# Patient Record
Sex: Female | Born: 1985 | Race: White | Hispanic: No | Smoking: Former smoker
Health system: Southern US, Academic
[De-identification: ages and names within clinical notes are randomized; demographics above are authoritative.]

## PROBLEM LIST (undated history)

## (undated) DIAGNOSIS — F4310 Post-traumatic stress disorder, unspecified: Secondary | ICD-10-CM

## (undated) DIAGNOSIS — Z22322 Carrier or suspected carrier of Methicillin resistant Staphylococcus aureus: Secondary | ICD-10-CM

## (undated) DIAGNOSIS — F419 Anxiety disorder, unspecified: Secondary | ICD-10-CM

## (undated) DIAGNOSIS — F329 Major depressive disorder, single episode, unspecified: Secondary | ICD-10-CM

## (undated) DIAGNOSIS — C539 Malignant neoplasm of cervix uteri, unspecified: Secondary | ICD-10-CM

## (undated) DIAGNOSIS — F32A Depression, unspecified: Secondary | ICD-10-CM

## (undated) DIAGNOSIS — K5010 Crohn's disease of large intestine without complications: Secondary | ICD-10-CM

## (undated) DIAGNOSIS — Z9851 Tubal ligation status: Secondary | ICD-10-CM

## (undated) DIAGNOSIS — S83519A Sprain of anterior cruciate ligament of unspecified knee, initial encounter: Secondary | ICD-10-CM

## (undated) DIAGNOSIS — M5430 Sciatica, unspecified side: Secondary | ICD-10-CM

## (undated) DIAGNOSIS — B199 Unspecified viral hepatitis without hepatic coma: Secondary | ICD-10-CM

## (undated) DIAGNOSIS — Z87898 Personal history of other specified conditions: Secondary | ICD-10-CM

## (undated) DIAGNOSIS — B002 Herpesviral gingivostomatitis and pharyngotonsillitis: Secondary | ICD-10-CM

## (undated) DIAGNOSIS — R569 Unspecified convulsions: Secondary | ICD-10-CM

## (undated) DIAGNOSIS — B1920 Unspecified viral hepatitis C without hepatic coma: Secondary | ICD-10-CM

## (undated) DIAGNOSIS — R748 Abnormal levels of other serum enzymes: Secondary | ICD-10-CM

## (undated) DIAGNOSIS — F1991 Other psychoactive substance use, unspecified, in remission: Secondary | ICD-10-CM

## (undated) DIAGNOSIS — A6000 Herpesviral infection of urogenital system, unspecified: Secondary | ICD-10-CM

## (undated) DIAGNOSIS — O009 Unspecified ectopic pregnancy without intrauterine pregnancy: Secondary | ICD-10-CM

## (undated) DIAGNOSIS — B977 Papillomavirus as the cause of diseases classified elsewhere: Secondary | ICD-10-CM

## (undated) DIAGNOSIS — Z87440 Personal history of urinary (tract) infections: Secondary | ICD-10-CM

## (undated) DIAGNOSIS — G4700 Insomnia, unspecified: Secondary | ICD-10-CM

## (undated) HISTORY — DX: Unspecified viral hepatitis without hepatic coma: B19.9

## (undated) HISTORY — PX: DILATION AND CURETTAGE, DIAGNOSTIC / THERAPEUTIC: SUR384

## (undated) HISTORY — DX: Depression, unspecified: F32.A

## (undated) HISTORY — PX: HX TONSILLECTOMY: SHX27

## (undated) HISTORY — DX: Tubal ligation status: Z98.51

## (undated) HISTORY — DX: Abnormal levels of other serum enzymes: R74.8

## (undated) HISTORY — DX: Sprain of anterior cruciate ligament of unspecified knee, initial encounter: S83.519A

## (undated) HISTORY — PX: HX TUBAL LIGATION: SHX77

## (undated) HISTORY — DX: Personal history of other specified conditions: Z87.898

## (undated) HISTORY — DX: Other psychoactive substance use, unspecified, in remission: F19.91

## (undated) HISTORY — DX: Anxiety disorder, unspecified: F41.9

## (undated) HISTORY — PX: ECTOPIC PREGNANCY SURGERY: SHX613

## (undated) HISTORY — PX: TONSILLECTOMY: SUR1361

## (undated) HISTORY — PX: TUBAL LIGATION: SHX77

---
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
## 2007-01-31 ENCOUNTER — Other Ambulatory Visit: Payer: Self-pay

## 2007-02-05 LAB — HISTORICAL SURGICAL PATHOLOGY SPECIMEN

## 2009-03-09 ENCOUNTER — Other Ambulatory Visit: Payer: Self-pay

## 2009-03-10 LAB — HISTORICAL SURGICAL PATHOLOGY SPECIMEN

## 2010-08-19 ENCOUNTER — Emergency Department (HOSPITAL_COMMUNITY): Payer: Self-pay

## 2014-01-02 HISTORY — PX: COLONOSCOPY: WVUENDOPRO10

## 2014-04-28 LAB — CBC
HCT: 40.9
HGB: 13.7
PLATELET COUNT: 347
WBC: 10.6

## 2014-04-28 LAB — NEISSERIA GONORRHOEAE DNA BY PCR
SPECIMEN SITE: NEGATIVE
SPECIMEN SITE: NEGATIVE

## 2014-04-28 LAB — RUBELLA VIRUS IGG AB: RUBELLA IGG: IMMUNE

## 2014-04-28 LAB — CYTOPATHOLOGY, GYN +/- HIGH RISK HPV: PAP SMEAR (1) DX PRO FEE: ABNORMAL

## 2014-04-28 LAB — CHLAMYDIA TRACHOMITIS DNA BY PCR (INHOUSE)
CHLAMYDIA TRACHOMATIS: NEGATIVE
CHLAMYDIA TRACHOMATIS: NEGATIVE

## 2014-04-28 LAB — SERUM SYPHILIS TEST: RPR: NEGATIVE

## 2014-05-21 ENCOUNTER — Encounter (INDEPENDENT_AMBULATORY_CARE_PROVIDER_SITE_OTHER): Payer: Self-pay

## 2014-05-21 DIAGNOSIS — O0990 Supervision of high risk pregnancy, unspecified, unspecified trimester: Secondary | ICD-10-CM | POA: Insufficient documentation

## 2014-05-21 DIAGNOSIS — O09219 Supervision of pregnancy with history of pre-term labor, unspecified trimester: Secondary | ICD-10-CM

## 2014-05-21 DIAGNOSIS — F1191 Opioid use, unspecified, in remission: Secondary | ICD-10-CM

## 2014-05-21 DIAGNOSIS — F419 Anxiety disorder, unspecified: Secondary | ICD-10-CM | POA: Insufficient documentation

## 2014-05-21 DIAGNOSIS — F1990 Other psychoactive substance use, unspecified, uncomplicated: Secondary | ICD-10-CM | POA: Insufficient documentation

## 2014-05-21 DIAGNOSIS — Z8614 Personal history of Methicillin resistant Staphylococcus aureus infection: Secondary | ICD-10-CM | POA: Insufficient documentation

## 2014-05-21 DIAGNOSIS — IMO0002 Reserved for concepts with insufficient information to code with codable children: Secondary | ICD-10-CM | POA: Insufficient documentation

## 2014-05-21 DIAGNOSIS — O09292 Supervision of pregnancy with other poor reproductive or obstetric history, second trimester: Secondary | ICD-10-CM | POA: Insufficient documentation

## 2014-05-21 DIAGNOSIS — F329 Major depressive disorder, single episode, unspecified: Secondary | ICD-10-CM

## 2014-05-21 DIAGNOSIS — Z98891 History of uterine scar from previous surgery: Secondary | ICD-10-CM

## 2014-05-21 DIAGNOSIS — F32A Depression, unspecified: Secondary | ICD-10-CM | POA: Insufficient documentation

## 2014-05-21 DIAGNOSIS — F1991 Other psychoactive substance use, unspecified, in remission: Secondary | ICD-10-CM | POA: Insufficient documentation

## 2014-05-21 DIAGNOSIS — Z87898 Personal history of other specified conditions: Principal | ICD-10-CM | POA: Insufficient documentation

## 2014-05-21 DIAGNOSIS — O09899 Supervision of other high risk pregnancies, unspecified trimester: Secondary | ICD-10-CM | POA: Insufficient documentation

## 2014-05-21 HISTORY — DX: Reserved for concepts with insufficient information to code with codable children: IMO0002

## 2014-05-21 HISTORY — DX: History of uterine scar from previous surgery: Z98.891

## 2014-05-21 HISTORY — DX: Personal history of Methicillin resistant Staphylococcus aureus infection: Z86.14

## 2014-05-21 HISTORY — DX: Supervision of pregnancy with other poor reproductive or obstetric history, second trimester: O09.292

## 2014-05-21 NOTE — Progress Notes (Signed)
Request for High Risk OB Consult from: Dr. Roderic Scarce      Reason for request: Hx of Heroine use    Records reviewed    Age: 29    Gravida/Para: [id number] (1 SAB & 1 ectopic)    LMP: 01/13/2014          EDD: 10/20/2014      Ultrasound on _4/26/2016_ = 9_ weeks _4_ days for EDD: 11/27/2014      Best EDD: 11/27/2014 (ultrasound)    Current Gestational age on:  05/21/2014 = [redacted]w[redacted]d gestation           Information:   Hx of preterm delivery 2009 - [redacted] weeks gestation  Hx of C-section 2009 - NRFHT; request OP report  Hx of MRSA - abscess left forearm; underwent I&D  Hx of anxiety and depression - weaning off Zoloft, Buspar, and Seroquel  Hx of second trimester loss - 18 week twin gestation  Family history of Cystic Fibrosis carrier - daughter  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

## 2014-06-15 ENCOUNTER — Other Ambulatory Visit (INDEPENDENT_AMBULATORY_CARE_PROVIDER_SITE_OTHER): Payer: Self-pay

## 2014-06-15 ENCOUNTER — Ambulatory Visit (INDEPENDENT_AMBULATORY_CARE_PROVIDER_SITE_OTHER): Payer: Medicaid Other

## 2015-05-28 ENCOUNTER — Encounter (HOSPITAL_COMMUNITY): Payer: Self-pay

## 2015-10-06 ENCOUNTER — Emergency Department
Admission: EM | Admit: 2015-10-06 | Discharge: 2015-10-06 | Disposition: A | Discharge status: Home / Self Care | Payer: Medicaid - Out of State | Attending: Emergency Medicine | Admitting: Emergency Medicine

## 2015-10-06 ENCOUNTER — Encounter: Payer: Self-pay | Admitting: Intensive Care

## 2015-10-06 DIAGNOSIS — Y999 Unspecified external cause status: Secondary | ICD-10-CM | POA: Insufficient documentation

## 2015-10-06 DIAGNOSIS — Z8541 Personal history of malignant neoplasm of cervix uteri: Secondary | ICD-10-CM | POA: Diagnosis not present

## 2015-10-06 DIAGNOSIS — L089 Local infection of the skin and subcutaneous tissue, unspecified: Secondary | ICD-10-CM | POA: Insufficient documentation

## 2015-10-06 DIAGNOSIS — Y9289 Other specified places as the place of occurrence of the external cause: Secondary | ICD-10-CM | POA: Insufficient documentation

## 2015-10-06 DIAGNOSIS — Y9389 Activity, other specified: Secondary | ICD-10-CM | POA: Diagnosis not present

## 2015-10-06 DIAGNOSIS — S90561A Insect bite (nonvenomous), right ankle, initial encounter: Secondary | ICD-10-CM | POA: Insufficient documentation

## 2015-10-06 DIAGNOSIS — L02415 Cutaneous abscess of right lower limb: Secondary | ICD-10-CM | POA: Diagnosis present

## 2015-10-06 DIAGNOSIS — W57XXXA Bitten or stung by nonvenomous insect and other nonvenomous arthropods, initial encounter: Secondary | ICD-10-CM | POA: Insufficient documentation

## 2015-10-06 HISTORY — DX: Malignant neoplasm of cervix uteri, unspecified: C53.9

## 2015-10-06 HISTORY — DX: Post-traumatic stress disorder, unspecified: F43.10

## 2015-10-06 HISTORY — DX: Carrier or suspected carrier of methicillin resistant Staphylococcus aureus: Z22.322

## 2015-10-06 HISTORY — DX: Depression, unspecified: F32.A

## 2015-10-06 HISTORY — DX: Anxiety disorder, unspecified: F41.9

## 2015-10-06 HISTORY — DX: Major depressive disorder, single episode, unspecified: F32.9

## 2015-10-06 NOTE — ED Notes (Signed)
Pt has insect bite to inner aspect of right ankle - area is red and irritated - provider in to examine pt

## 2015-10-06 NOTE — ED Triage Notes (Signed)
Patient presents to ER with c/o of possible spider bite. She states "I was cutting wood and getting eat up alive by bugs so I am not sure what bit me." Patient has small abscess on R lower outer leg. No drainage noted. Redness around area. Patient reports she cannot feel all around area. A&O x4

## 2015-10-06 NOTE — Discharge Instructions (Signed)
You appear to have a local reaction to the ankle. It does not appear to be infected at this time. You have created some local skin irritation with the use of alcohol and peroxide. Avoid using these products to cleanse you wound. Use only soap and water and cover with antibiotic ointment. Follow-up with [facility] as needed.

## 2015-10-07 NOTE — ED Provider Notes (Signed)
[facility] Emergency Department Provider Note ____________________________________________  Time seen: 1857  I have reviewed the triage vital signs and the nursing notes.  HISTORY  Chief Complaint  Abscess  HPI Tracey Torres is a 30 y.o. female visits to the ED for evaluation what she assumes be a spider or insect bite. She describes last week she was cutting wood and was exposed to several bugs that were biting her constantly. At some point she sustained what she assumes be an insect bite to the right medial ankle. She describes what was initially a bump that appeared like a mosquito bite. She does have redness and inflammation to the area. Since that time she's been cleansing the area multiple times daily with alcohol and peroxide. She now has a focal area of redness with a central ulcer that is dried at this time. She denies any spread of the redness from the area. She denies any interim fevers, chills, or sweats. She also denies any purulent drainage from the area.  Past Medical History:  Diagnosis Date  . Anxiety   . Cervical cancer ([place])   . Depression   . MRSA carrier   . PTSD (post-traumatic stress disorder)     There are no active problems to display for this patient.   Past Surgical History:  Procedure Laterality Date  . ECTOPIC PREGNANCY SURGERY    . TONSILLECTOMY    . TUBAL LIGATION      Prior to Admission medications   Not on File    Allergies Amoxicillin; Dye fdc red [red dye]; Shellfish allergy; and Motrin [ibuprofen]  History reviewed. No pertinent family history.  Social History Social History  Substance Use Topics  . Smoking status: Never Smoker  . Smokeless tobacco: Never Used  . Alcohol use No    Review of Systems  Constitutional: Negative for fever. Cardiovascular: Negative for chest pain. Respiratory: Negative for shortness of breath. Gastrointestinal: Negative for abdominal pain, vomiting and  diarrhea. Musculoskeletal: Negative for back pain. Skin: Negative for rash. Insect bite as above Neurological: Negative for headaches, focal weakness or numbness. ____________________________________________  PHYSICAL EXAM:  VITAL SIGNS: ED Triage Vitals [10/06/15 1825]  Enc Vitals Group     BP 120/86     Pulse Rate 92     Resp 16     Temp 98.2 F (36.8 C)     Temp Source Oral     SpO2 98 %     Weight 150 lb (68 kg)     Height 5\' 4"  (1.626 m)     Head Circumference      Peak Flow      Pain Score 8     Pain Loc      Pain Edu?      Excl. in [place]?    Constitutional: Alert and oriented. Well appearing and in no distress. Head: Normocephalic and atraumatic. Cardiovascular: Normal distal pulses and cap refill  Respiratory: Normal respiratory effort.  Musculoskeletal: Nontender with normal range of motion in all extremities.  Neurologic:  Normal gait without ataxia. Normal speech and language. No gross focal neurologic deficits are appreciated. Skin:  Skin is warm, dry and intact. Right medial ankle with a local skin ulcer with surrounding erythema about the size of a quarter. No streaking, fluctuance, pointing, or drainage. The central ulceration is about 1 cm in diameter. The bed shows dried eschar.  ____________________________________________  INITIAL IMPRESSION / ASSESSMENT AND PLAN / ED COURSE  Patient was appears to be a local  insect bite reaction that is not aggravated by application alcohol and hydrogen peroxide. There is no signs of any focal cellulitis or pointing fluctuance. The area is likely inflamed at this time. Patient is advised to discontinue use of alcohol and peroxide she will apply antibiotic ointment to the area and keep the wound covered going forward. No indication at this time for antibiotic course. She will follow-up with a local primary care provider or urgent care as needed.  Clinical Course   ____________________________________________  FINAL  CLINICAL IMPRESSION(S) / ED DIAGNOSES  Final diagnoses:  Insect bite (nonvenomous), right ankle, initial encounter  Insect bite of ankle with local reaction, right, initial encounter      Melvenia Needles, PA-C 10/08/15 0029    Melvenia Needles, PA-C 10/08/15 0029    Rudene Re, MD 10/08/15 0124

## 2015-10-27 ENCOUNTER — Emergency Department
Admission: EM | Admit: 2015-10-27 | Discharge: 2015-10-27 | Disposition: A | Discharge status: Home / Self Care | Payer: Medicaid - Out of State | Attending: Emergency Medicine | Admitting: Emergency Medicine

## 2015-10-27 ENCOUNTER — Emergency Department: Payer: Medicaid - Out of State

## 2015-10-27 ENCOUNTER — Encounter: Payer: Self-pay | Admitting: Emergency Medicine

## 2015-10-27 DIAGNOSIS — M25562 Pain in left knee: Secondary | ICD-10-CM | POA: Diagnosis present

## 2015-10-27 DIAGNOSIS — W01198A Fall on same level from slipping, tripping and stumbling with subsequent striking against other object, initial encounter: Secondary | ICD-10-CM | POA: Diagnosis not present

## 2015-10-27 DIAGNOSIS — Z8541 Personal history of malignant neoplasm of cervix uteri: Secondary | ICD-10-CM | POA: Insufficient documentation

## 2015-10-27 DIAGNOSIS — Y999 Unspecified external cause status: Secondary | ICD-10-CM | POA: Diagnosis not present

## 2015-10-27 DIAGNOSIS — H60332 Swimmer's ear, left ear: Secondary | ICD-10-CM | POA: Diagnosis not present

## 2015-10-27 DIAGNOSIS — Y929 Unspecified place or not applicable: Secondary | ICD-10-CM | POA: Diagnosis not present

## 2015-10-27 DIAGNOSIS — Y9339 Activity, other involving climbing, rappelling and jumping off: Secondary | ICD-10-CM | POA: Insufficient documentation

## 2015-10-27 MED ORDER — OFLOXACIN 0.3 % OT SOLN: 5.0000 [drp] | Freq: Two times a day (BID) | OTIC | 0 refills | Status: AC

## 2015-10-27 MED ORDER — PREDNISONE 50 MG PO TABS: 50.0000 mg | ORAL_TABLET | Freq: Every day | ORAL | 0 refills | Status: AC

## 2015-10-27 MED ORDER — OXYCODONE-ACETAMINOPHEN 5-325 MG PO TABS: 1.0000 | ORAL_TABLET | Freq: Four times a day (QID) | ORAL | 0 refills | Status: AC | PRN

## 2015-10-27 NOTE — ED Triage Notes (Signed)
Pt reports falling and twisting left knee 3 days ago. Pt also reports hitting head on side of tree and can't hear out of left ear and reports it's "leaking juice".

## 2015-10-27 NOTE — ED Provider Notes (Signed)
[facility] Emergency Department Provider Note  ____________________________________________  Time seen: Approximately 5:47 PM  I have reviewed the triage vital signs and the nursing notes.   HISTORY  Chief Complaint Otalgia and Knee Pain    HPI Tracey Torres is a 30 y.o. female resistance the emergency department complaining of left ear pain and left knee pain. Patient reports that 3 days ago she was trying to climb over a log when her foot slipped and she twisted her left knee. Patient has had severe pain to the medial aspect of the left knee since occurrence. Patient reports pain is sharp, constant, worse with movement or ambulation. Patient is tried over-the-counter medications with no relief. Patient also is reporting sharp left ear pain. Patient had some URI symptoms and did strike the side of her face when she fell. Patient is now complaining of sharp left ear pain with drainage. No hearing loss but states that hearing is muffled on that side. No fevers or chills, visual changes, sore throat, cough, shortness of breath, chest pain, nausea or vomiting.   Past Medical History:  Diagnosis Date  . Anxiety   . Cervical cancer ([place])   . Depression   . MRSA carrier   . PTSD (post-traumatic stress disorder)     There are no active problems to display for this patient.   Past Surgical History:  Procedure Laterality Date  . ECTOPIC PREGNANCY SURGERY    . TONSILLECTOMY    . TUBAL LIGATION      Prior to Admission medications   Medication Sig Start Date End Date Taking? Authorizing Provider  ofloxacin (FLOXIN) 0.3 % otic solution Place 5 drops into the left ear 2 (two) times daily. 10/27/15 11/06/15  Roderic Palau D Leanne Sisler, PA-C  oxyCODONE-acetaminophen (ROXICET) 5-325 MG tablet Take 1 tablet by mouth every 6 (six) hours as needed for severe pain. 10/27/15   Charline Bills Damani Kelemen, PA-C  predniSONE (DELTASONE) 50 MG tablet Take 1 tablet (50 mg total) by mouth  daily with breakfast. 10/27/15   Charline Bills Lile Mccurley, PA-C    Allergies Amoxicillin; Dye fdc red [red dye]; Shellfish allergy; and Motrin [ibuprofen]  No family history on file.  Social History Social History  Substance Use Topics  . Smoking status: Never Smoker  . Smokeless tobacco: Never Used  . Alcohol use No     Review of Systems  Constitutional: No fever/chills Eyes: No visual changes. No discharge ENT: Positive for left ear pain Cardiovascular: no chest pain. Respiratory: no cough. No SOB. Musculoskeletal: Positive for left knee pain Skin: Negative for rash, abrasions, lacerations, ecchymosis. Neurological: Negative for headaches, focal weakness or numbness. 10-point ROS otherwise negative.  ____________________________________________   PHYSICAL EXAM:  VITAL SIGNS: ED Triage Vitals  Enc Vitals Group     BP 10/27/15 1653 110/80     Pulse Rate 10/27/15 1653 (!) 118     Resp 10/27/15 1653 16     Temp 10/27/15 1653 98.2 F (36.8 C)     Temp Source 10/27/15 1653 Oral     SpO2 10/27/15 1653 97 %     Weight 10/27/15 1653 150 lb (68 kg)     Height 10/27/15 1653 5\' 4"  (1.626 m)     Head Circumference --      Peak Flow --      Pain Score 10/27/15 1654 10     Pain Loc --      Pain Edu? --      Excl. in [place]? --  Constitutional: Alert and oriented. Well appearing and in no acute distress. Eyes: Conjunctivae are normal. PERRL. EOMI. Head: Atraumatic. ENT:      Ears: EACs and TMs is unremarkable on right. EAC is erythematous, edematous, with purulent drainage to the left. TM is not completely visualized but what is visualized reveals no areas of puncture. Hearing intact but decreased on left when compared with right.      Nose: No congestion/rhinnorhea.      Mouth/Throat: Mucous membranes are moist.  Neck: No stridor.   Hematological/Lymphatic/Immunilogical: No cervical lymphadenopathy. Cardiovascular: Normal rate, regular rhythm. Normal S1 and S2.  Good  peripheral circulation. Respiratory: Normal respiratory effort without tachypnea or retractions. Lungs CTAB. Good air entry to the bases with no decreased or absent breath sounds. Musculoskeletal: Full range of motion to all extremities. No gross deformities appreciated. No gross deformity or edema noted to left knee but inspection. Limited range of motion due to pain. Patient is tender to palpation along the MCL distribution in the medial joint line. No specific abnormality is palpated. Lachman's is negative. Positive valgus test to the left knee for laxity. Positive McMurray's for medial meniscal injury. Sensation pulses intact distally. Neurologic:  Normal speech and language. No gross focal neurologic deficits are appreciated.  Skin:  Skin is warm, dry and intact. No rash noted. Psychiatric: Mood and affect are normal. Speech and behavior are normal. Patient exhibits appropriate insight and judgement.   ____________________________________________   LABS (all labs ordered are listed, but only abnormal results are displayed)  Labs Reviewed - No data to display ____________________________________________  EKG   ____________________________________________  RADIOLOGY   Dg Knee Complete 4 Views Left  Result Date: 10/27/2015 CLINICAL DATA:  [place] 2 days ago with pain, swelling and bruising medially. EXAM: LEFT KNEE - COMPLETE 4+ VIEW COMPARISON:  None. FINDINGS: Anteromedial soft tissue swelling of the knee with small suprapatellar joint effusion. No acute displaced fracture, intra-articular loose body nor bone destruction. Bone island noted of the proximal tibial metaphysis. IMPRESSION: Soft tissue swelling with small suprapatellar joint effusion. No acute osseous abnormality. Electronically Signed   By: Ashley Royalty M.D.   On: 10/27/2015 18:55    ____________________________________________    PROCEDURES  Procedure(s) performed:    Procedures    Medications - No data to  display   ____________________________________________   INITIAL IMPRESSION / ASSESSMENT AND PLAN / ED COURSE  Pertinent labs & imaging results that were available during my care of the patient were reviewed by me and considered in my medical decision making (see chart for details).  Review of the [place] CSRS was performed in accordance of the [place] prior to dispensing any controlled drugs.  Clinical Course    Patient's diagnosis is consistent with Left knee injury. Patient does have some laxity in the MCL distribution when compared with the unaffected extremity. Positive McMurray's. Patient has concerning signs for either meniscal or anterior cruciate ligament injury. As such, he is stabilized using knee immobilizer and patient is given crutches. She is allergic to NSAIDs and will be placed on short course of steroids for inflammatory control and limited pain medication. Patient also has an unrelated finding of left-sided otitis externa. This will be treated with topical antibiotics. Patient will follow-up with orthopedics for her knee and primary care for ear complaint..Patient is given ED precautions to return to the ED for any worsening or new symptoms.     ____________________________________________  FINAL CLINICAL IMPRESSION(S) / ED DIAGNOSES  Final diagnoses:  Acute pain  of left knee  Acute swimmer's ear of left side      NEW MEDICATIONS STARTED DURING THIS VISIT:  New Prescriptions   OFLOXACIN (FLOXIN) 0.3 % OTIC SOLUTION    Place 5 drops into the left ear 2 (two) times daily.   OXYCODONE-ACETAMINOPHEN (ROXICET) 5-325 MG TABLET    Take 1 tablet by mouth every 6 (six) hours as needed for severe pain.   PREDNISONE (DELTASONE) 50 MG TABLET    Take 1 tablet (50 mg total) by mouth daily with breakfast.        This chart was dictated using voice recognition software/Dragon. Despite best efforts to proofread, errors can occur which can change the meaning. Any change was  purely unintentional.    Darletta Moll, PA-C 10/27/15 1931    Eula Listen, MD 10/27/15 2300

## 2015-11-12 ENCOUNTER — Emergency Department
Admission: EM | Admit: 2015-11-12 | Discharge: 2015-11-12 | Disposition: A | Discharge status: Home / Self Care | Payer: Medicaid - Out of State | Attending: Emergency Medicine | Admitting: Emergency Medicine

## 2015-11-12 ENCOUNTER — Encounter: Payer: Self-pay | Admitting: *Deleted

## 2015-11-12 DIAGNOSIS — Y999 Unspecified external cause status: Secondary | ICD-10-CM | POA: Diagnosis not present

## 2015-11-12 DIAGNOSIS — Z8541 Personal history of malignant neoplasm of cervix uteri: Secondary | ICD-10-CM | POA: Diagnosis not present

## 2015-11-12 DIAGNOSIS — W260XXA Contact with knife, initial encounter: Secondary | ICD-10-CM | POA: Diagnosis not present

## 2015-11-12 DIAGNOSIS — Y939 Activity, unspecified: Secondary | ICD-10-CM | POA: Diagnosis not present

## 2015-11-12 DIAGNOSIS — S61213A Laceration without foreign body of left middle finger without damage to nail, initial encounter: Secondary | ICD-10-CM | POA: Diagnosis present

## 2015-11-12 DIAGNOSIS — Y929 Unspecified place or not applicable: Secondary | ICD-10-CM | POA: Insufficient documentation

## 2015-11-12 DIAGNOSIS — Z79899 Other long term (current) drug therapy: Secondary | ICD-10-CM | POA: Insufficient documentation

## 2015-11-12 DIAGNOSIS — Z23 Encounter for immunization: Secondary | ICD-10-CM | POA: Insufficient documentation

## 2015-11-12 MED ORDER — LIDOCAINE-EPINEPHRINE-TETRACAINE (LET) SOLUTION
3.0000 mL | Freq: Once | NASAL | Status: AC
  Administered 2015-11-12: 3 mL via TOPICAL
  Filled 2015-11-12: qty 3

## 2015-11-12 MED ORDER — TETANUS-DIPHTH-ACELL PERTUSSIS 5-2.5-18.5 LF-MCG/0.5 IM SUSP
0.5000 mL | Freq: Once | INTRAMUSCULAR | Status: AC
  Administered 2015-11-12: 0.5 mL via INTRAMUSCULAR
  Filled 2015-11-12: qty 0.5

## 2015-11-12 NOTE — ED Provider Notes (Signed)
[facility] Emergency Department Provider Note  ____________________________________________  Time seen: Approximately 4:32 PM  I have reviewed the triage vital signs and the nursing notes.   HISTORY  Chief Complaint Laceration    HPI Tracey Torres is a 30 y.o. female, NAD, who presents to the emergency department for evaluation of a laceration on the 3rd digit of the left hand. Patient reports she was using a pocket knife to cut a hole in her suitcase this afternoon when the knife slipped and cut her finger. She reports pain is 10/10 at the site of the laceration. The bleeding was controlled with pressure. She denies any numbness, tingling, or loss of sensation of the finger.  She is unsure of her last tetanus vaccine. She has not taken any medications prior to arrival.    Past Medical History:  Diagnosis Date  . Anxiety   . Cervical cancer ([place])   . Depression   . MRSA carrier   . PTSD (post-traumatic stress disorder)     There are no active problems to display for this patient.   Past Surgical History:  Procedure Laterality Date  . ECTOPIC PREGNANCY SURGERY    . TONSILLECTOMY    . TUBAL LIGATION      Prior to Admission medications   Medication Sig Start Date End Date Taking? Authorizing Provider  oxyCODONE-acetaminophen (ROXICET) 5-325 MG tablet Take 1 tablet by mouth every 6 (six) hours as needed for severe pain. 10/27/15   Charline Bills Cuthriell, PA-C  predniSONE (DELTASONE) 50 MG tablet Take 1 tablet (50 mg total) by mouth daily with breakfast. 10/27/15   Charline Bills Cuthriell, PA-C    Allergies Amoxicillin; Dye fdc red [red dye]; Shellfish allergy; and Motrin [ibuprofen]  No family history on file.  Social History Social History  Substance Use Topics  . Smoking status: Never Smoker  . Smokeless tobacco: Never Used  . Alcohol use No     Review of Systems  Constitutional: No fatigue Musculoskeletal: Positive for left middle finger  pain at site of laceration. Skin: Positive laceration to left middle finger. No erythema, streaking, ecchymosis or swelling at the site.  Neurological: Negative for numbness, tingling, or loss of sensation in affected digit.    ____________________________________________   PHYSICAL EXAM:  VITAL SIGNS: ED Triage Vitals  Enc Vitals Group     BP 11/12/15 1546 129/89     Pulse Rate 11/12/15 1546 (!) 108     Resp 11/12/15 1546 18     Temp 11/12/15 1546 98.1 F (36.7 C)     Temp Source 11/12/15 1546 Oral     SpO2 11/12/15 1546 98 %     Weight 11/12/15 1547 150 lb (68 kg)     Height 11/12/15 1547 5\' 3"  (1.6 m)     Head Circumference --      Peak Flow --      Pain Score 11/12/15 1548 10     Pain Loc --      Pain Edu? --      Excl. in [place]? --     Constitutional: Alert and oriented. Well appearing. Patient tearful in exam room. Eyes: Conjunctivae are normal. Head: Atraumatic. Cardiovascular: Good peripheral circulation with 2+ pulses in the left upper extremity. Capillary refill is brisk in all digits of the left hand Respiratory: Normal respiratory effort without tachypnea or retractions. Musculoskeletal: Pain at the site of the laceration on the left middle finger. Full range of motion and strength in the extremity. No  crepitus, bony abnormality, or joint effusion noted. Neurologic:  Normal speech and language. No gross focal neurologic deficits are appreciated.  Skin: superficial 0.5 cm laceration to the distal portion of the left middle finger lateral to the nail. Laceration did not extend into or disrupt the nailbed. No foreign bodies visualized or palpated. No erythema, streaking, swelling, oozing, active bleeding. Psychiatric: Mood and affect are normal. Speech and behavior are normal. Patient exhibits appropriate insight and judgement.   ____________________________________________    LABS  None ____________________________________________  EKG  None ____________________________________________  RADIOLOGY   No results found.  ____________________________________________    PROCEDURES  Procedure(s) performed: None   .Marland KitchenLaceration Repair Date/Time: 11/12/2015 5:10 PM Performed by: Natale Milch L Authorized by: Braxton Feathers   Consent:    Consent obtained:  Verbal   Consent given by:  Patient   Risks discussed:  Infection and pain   Alternatives discussed:  No treatment Anesthesia (see MAR for exact dosages):    Anesthesia method:  Topical application   Topical anesthetic:  LET Laceration details:    Location:  Finger   Finger location:  L long finger   Length (cm):  1   Depth (mm):  2 Repair type:    Repair type:  Simple Exploration:    Hemostasis achieved with:  Direct pressure   Wound exploration: wound explored through full range of motion and entire depth of wound probed and visualized     Wound extent: no foreign bodies/material noted, no muscle damage noted, no nerve damage noted, no tendon damage noted, no underlying fracture noted and no vascular damage noted     Contaminated: no   Treatment:    Area cleansed with:  Betadine   Amount of cleaning:  Standard   Irrigation solution:  Sterile saline   Foreign body removal: No foreign bodies.   Skin repair:    Repair method:  Tissue adhesive Approximation:    Approximation:  Close   Vermilion border: well-aligned   Post-procedure details:    Dressing:  Non-adherent dressing   Patient tolerance of procedure:  Tolerated well, no immediate complications     Medications  Tdap (BOOSTRIX) injection 0.5 mL (0.5 mLs Intramuscular Given 11/12/15 1639)  lidocaine-EPINEPHrine-tetracaine (LET) solution (3 mLs Topical Given 11/12/15 1651)     ____________________________________________   INITIAL IMPRESSION / ASSESSMENT AND PLAN / ED COURSE  Pertinent labs & imaging results that  were available during my care of the patient were reviewed by me and considered in my medical decision making (see chart for details).  Clinical Course     Patient's diagnosis is consistent with laceration to the left middle finger. The superficial laceration was soaked in warm water and surgical wash. LET solution was applied to the area to decrease patient's pain. The wound was re-approximated and closed with Dermabond. Patient will be discharged home with instructions to take over the counter tylenol for the pain and to keep the wound clean and dry. Patient is to follow up with primary care if symptoms persist past this treatment course. Patient is given ED precautions to return to the ED for any worsening or new symptoms.    ____________________________________________  FINAL CLINICAL IMPRESSION(S) / ED DIAGNOSES  Final diagnoses:  Laceration of left middle finger without foreign body without damage to nail, initial encounter      NEW MEDICATIONS STARTED DURING THIS VISIT:  New Prescriptions   No medications on file         Kemp,  PA-C 11/12/15 1745    Lisa Roca, MD 11/12/15 [phone]

## 2015-11-12 NOTE — ED Triage Notes (Signed)
Patient accidentally cut left third finger with knife today.

## 2017-07-02 IMAGING — DX DG KNEE COMPLETE 4+V*L*
3 series · 4 of 4 positions shown · non-contrast
Comparison: None.

CLINICAL DATA: Fell 2 days ago with pain, swelling and bruising
medially.

EXAM:
LEFT KNEE - COMPLETE 4+ VIEW

[knee ap]
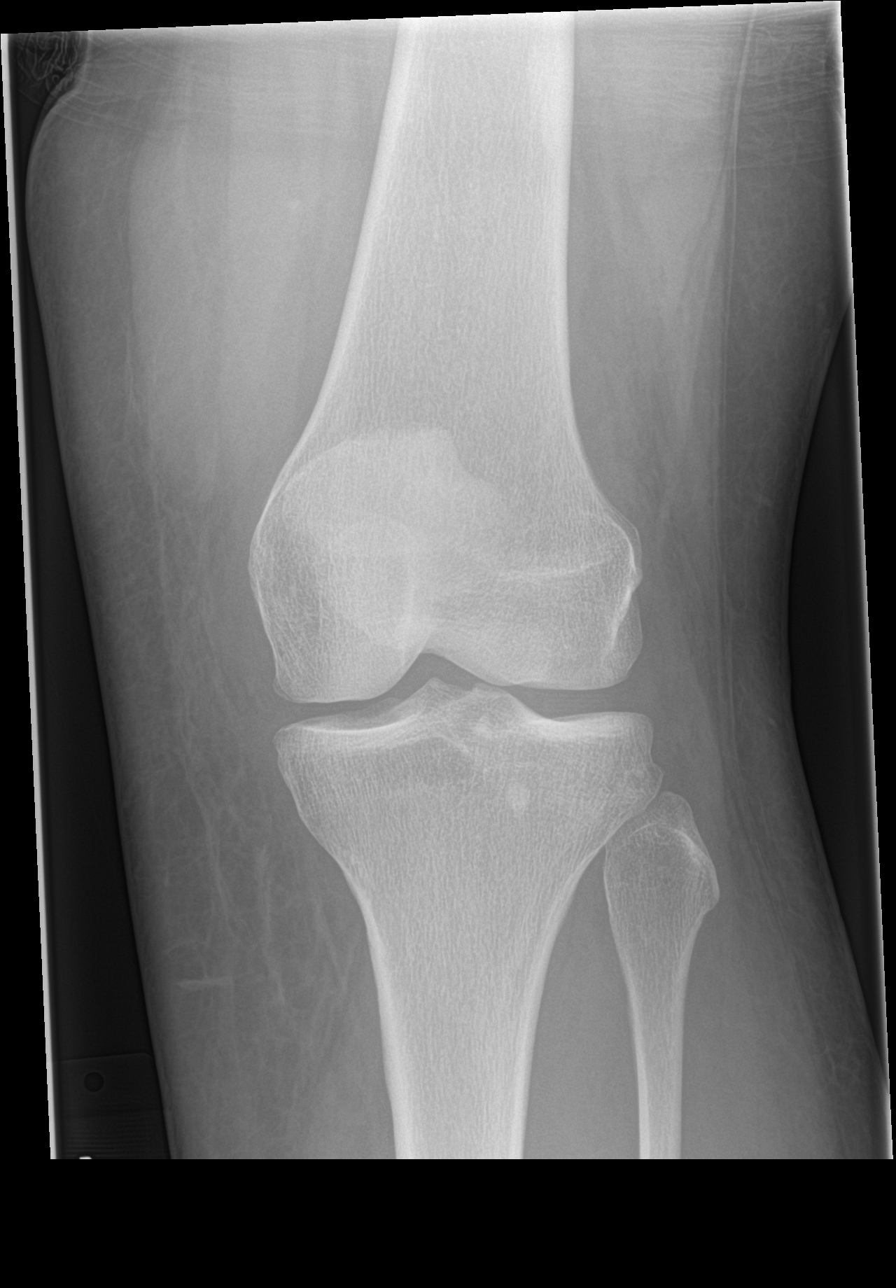

[knee lat]
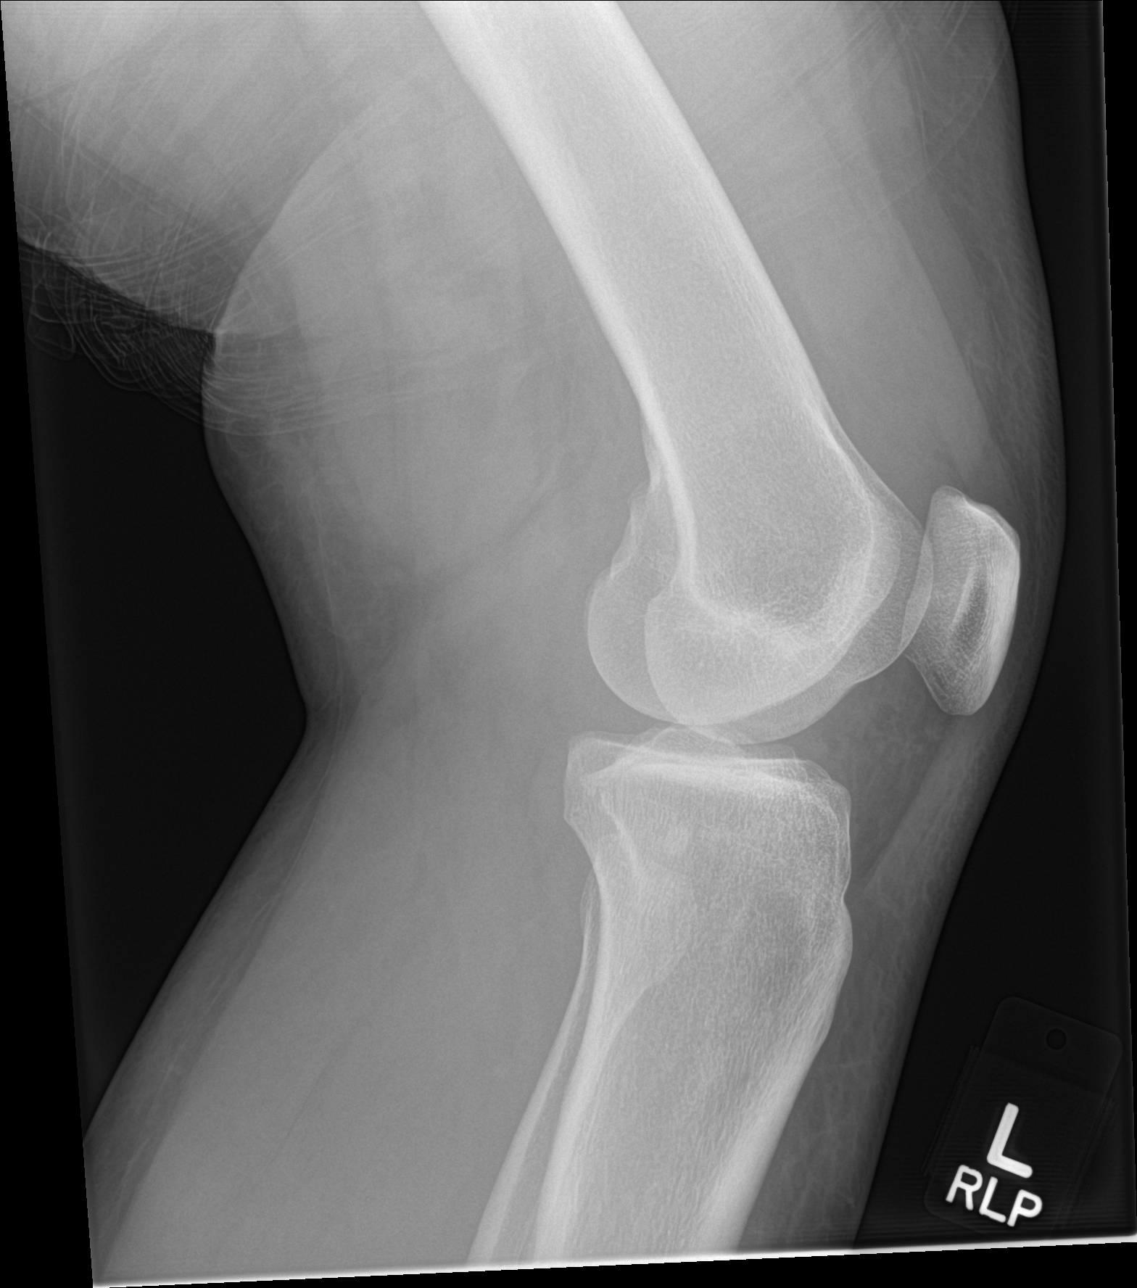

[Series 5: knee obl · 0.14mm/px · 2 of 2 slices shown]
[im 1/2]
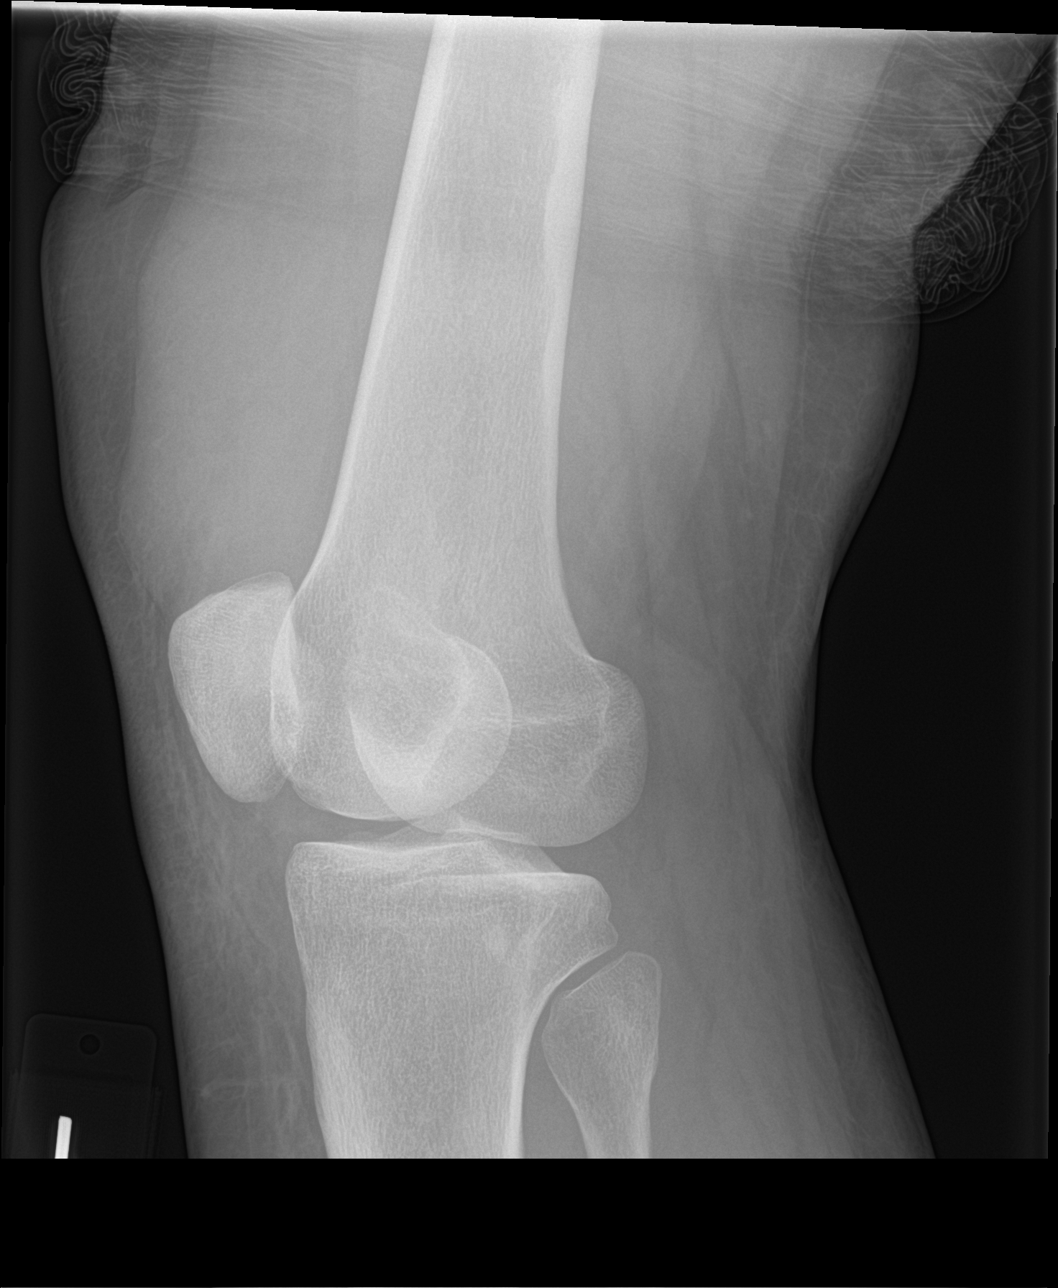
[im 2/2]
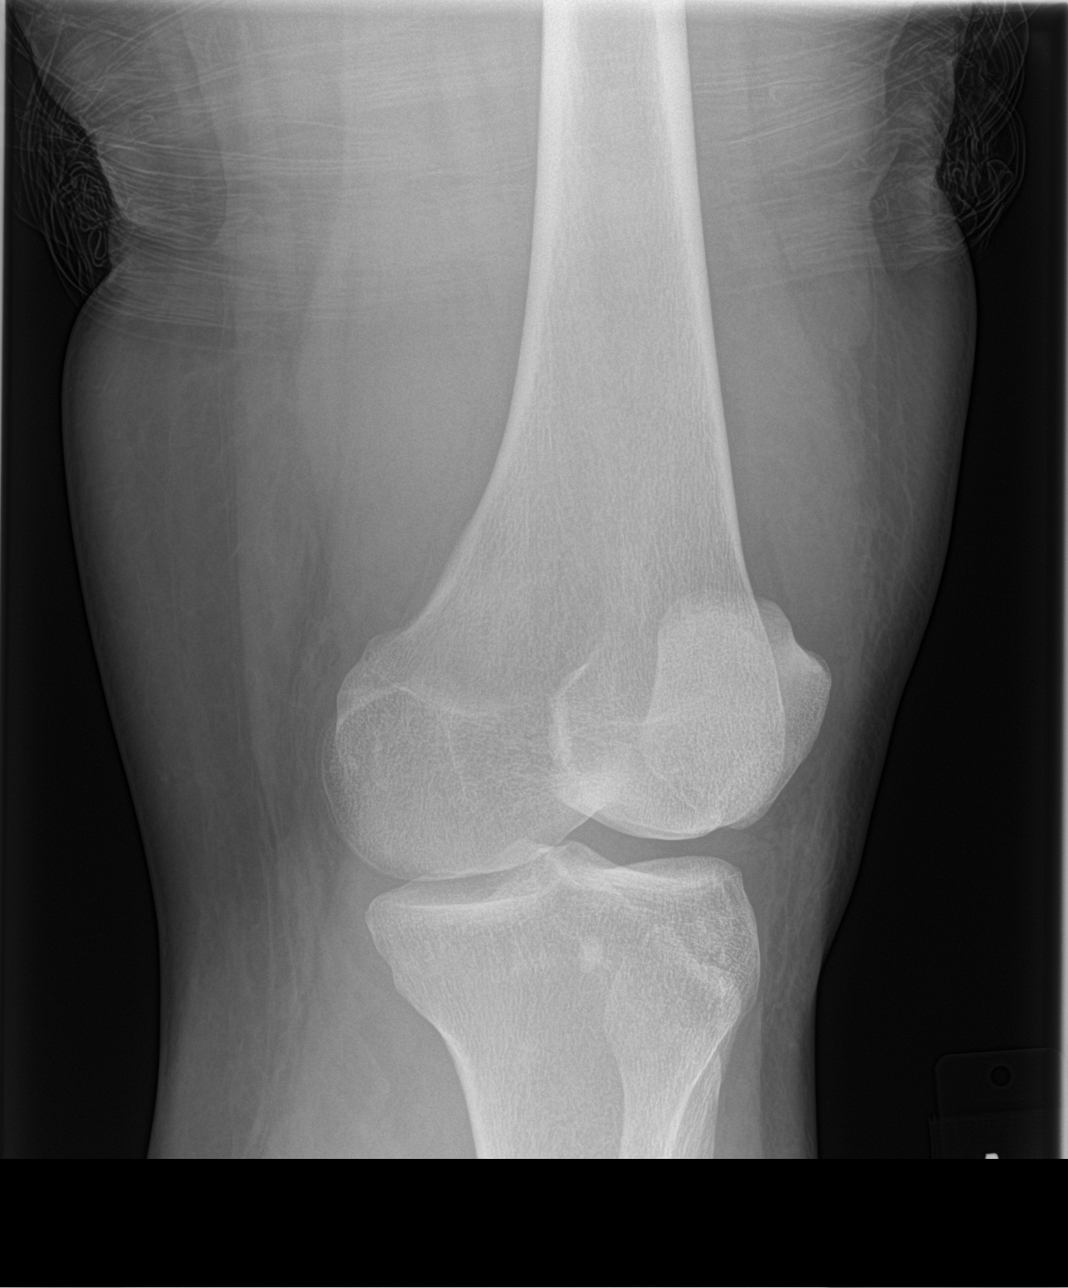

[4 of 4 positions shown; findings below may reference images not displayed]

FINDINGS: Anteromedial soft tissue swelling of the knee with small
suprapatellar joint effusion. No acute displaced fracture,
intra-articular loose body nor bone destruction. Bone island noted
of the proximal tibial metaphysis.
IMPRESSION: Soft tissue swelling with small suprapatellar joint effusion. No
acute osseous abnormality.

## 2019-01-15 ENCOUNTER — Encounter (HOSPITAL_COMMUNITY): Payer: Self-pay

## 2019-01-15 ENCOUNTER — Emergency Department
Admission: EM | Admit: 2019-01-15 | Discharge: 2019-01-15 | Payer: MEDICAID | Attending: Emergency Medicine | Admitting: Emergency Medicine

## 2019-01-15 ENCOUNTER — Other Ambulatory Visit: Payer: Self-pay

## 2019-01-15 DIAGNOSIS — L0390 Cellulitis, unspecified: Secondary | ICD-10-CM

## 2019-01-15 DIAGNOSIS — M79631 Pain in right forearm: Secondary | ICD-10-CM | POA: Insufficient documentation

## 2019-01-15 DIAGNOSIS — F1990 Other psychoactive substance use, unspecified, uncomplicated: Secondary | ICD-10-CM

## 2019-01-15 DIAGNOSIS — L03113 Cellulitis of right upper limb: Secondary | ICD-10-CM | POA: Insufficient documentation

## 2019-01-15 HISTORY — DX: Unspecified ectopic pregnancy without intrauterine pregnancy: O00.90

## 2019-01-15 HISTORY — DX: Crohn's disease of large intestine without complications (CMS HCC): K50.10

## 2019-01-15 MED ORDER — CLINDAMYCIN HCL 150 MG CAPSULE
450.00 mg | ORAL_CAPSULE | Freq: Three times a day (TID) | ORAL | 0 refills | Status: DC
Start: 2019-01-15 — End: 2019-01-15

## 2019-01-15 MED ORDER — CLINDAMYCIN HCL 150 MG CAPSULE
450.00 mg | ORAL_CAPSULE | Freq: Three times a day (TID) | ORAL | 0 refills | Status: AC
Start: 2019-01-15 — End: 2019-01-25

## 2019-01-15 MED ORDER — CLINDAMYCIN HCL 150 MG CAPSULE
450.00 mg | ORAL_CAPSULE | ORAL | Status: AC
Start: 2019-01-15 — End: 2019-01-15
  Administered 2019-01-15: 450 mg via ORAL
  Filled 2019-01-15: qty 3

## 2019-01-15 NOTE — ED Provider Notes (Signed)
Emergency Department Provider Note  HPI - 01/15/2019    History and Physical Exam     Amanda Levine 34 y.o. female  Date of Birth: Dec 10, 1985     Attending: Dr. Richarda Blade    PCP: No Pcp      Chief Complaint   Patient presents with   . Arm Pain       COVID-19 PANDEMIC IN EFFECT    Initial ED provider evaluation performed at 0401    History Provided by: patient     HPI:  Amanda Levine is a 34 y.o. female who presents to the ED today via law enforcement to be medically cleared for incarceration. Patient states that she was arrested PTA, and she needs to have the lesions on her right forearm evaluated prior to going to jail. She states that the lesions have been present for around 2 weeks, and she c/o mild pain to the right forearm at the site of the lesions. She endorses regular IVDA. Patient denies fever, N/V/D, and there are no other complaints at this time.    PMHx: Crohn's colitis  PSHx: C-section, colonoscopy  SHx: IVDA  Allergies: Amoxicillin    Review of Systems:    Constitutional: No fever or chills.  Skin: +lesions to R forearm. No rashes.  HENT: No head injury.   Respiratory: No cough or SOB.  GI/abdomen: No nausea/vomiting. No diarrhea or constipation.  MSK/Extremities: +R forearm pain. No neck or back pain. No leg swelling.   Neuro: No headache.   Psych: +medical clearance for incarceration +endorses IVDA. No SI, HI.  All other systems reviewed and are negative, unless commented on in the HPI.      Medical History     Filed Vitals:    01/15/19 0407   BP: 120/84   Pulse: (!) 102   Resp: 18   Temp: 36.3 C (97.4 F)   SpO2: 98%       PMHx:    Medical History     Diagnosis Date Comment Source    Crohn's colitis (CMS HCC)       Ectopic pregnancy            Allergies:    Allergies   Allergen Reactions   . Amoxicillin        Social History  Social History     Tobacco Use   . Smoking status: Never Smoker   . Smokeless tobacco: Never Used   Substance Use Topics   . Alcohol use: Not Currently   . Drug  use: Yes     Types: Methamphetamines, Marijuana     Comment: today        Family History  Family Medical History:     None           Home Meds:   No current facility-administered medications for this encounter.     Current Outpatient Medications:   .  clindamycin (CLEOCIN) 150 mg Oral Capsule, Take 3 Caps (450 mg total) by mouth Three times a day for 10 days, Disp: 90 Cap, Rfl: 0          Exam and Objective Findings     Physical Exam  Nursing note and vitals reviewed.  Vital signs reviewed as above.     Constitutional: Pt is awake, alert, and in no acute distress.   Skin: 2 areas of erythema to the right forearm, 2 cm each. One at the volar aspect and one on the dorsum aspect. Both with  surrounding track marks. No rash.  HENT: Atraumatic.   Cardiovascular: Distal pulses intact bilaterally.  Respiratory: Effort normal. No respiratory distress.   MSK/Extremities: Normal range of motion.   Neurological: Patient is alert and oriented to person, place and time.   Psychiatric: Patient has a normal mood and affect.     Work-up:  Orders Placed This Encounter   . clindamycin (CLEOCIN) capsule   . clindamycin (CLEOCIN) 150 mg Oral Capsule        Assessment & Plan     Plan: Appropriate testing. Medical Records reviewed.    MDM:      Pt remained stable throughout the emergency department course.         Disposition     Impression:   Diagnoses     Diagnosis Comment Added By Time Added    Cellulitis  Theodis Aguas, MD 01/15/2019  4:04 AM    IV drug user  Theodis Aguas, MD 01/15/2019  4:04 AM        Disposition:  Discharged     Discussed results, diagnosis, and treatment with the patient.   Medication instructions were discussed with the patient.   It was advised that the patient return to the ED with any new, concerning, or worsening symptoms, and follow up as directed.    The patient verbalized understanding of all instructions and had no further questions or concerns.     Follow up:   [facility] -  Emergency Department  [address] [place]  [place] [address]  [phone]  Go to   If symptoms worsen      Prescriptions:   Discharge Medication List as of 01/15/2019  4:05 AM      START taking these medications    Details   clindamycin (CLEOCIN) 150 mg Oral Capsule Take 3 Caps (450 mg total) by mouth Three times a day for 10 days, Disp-90 Cap, R-0, Print             I am scribing for, and in the presence of, Dr. Richarda Blade, for services provided on 01/15/2019.  Rosezena Sensor, SCRIBE    Rosezena Sensor, [place] 01/15/2019, 04:04    I personally performed the services described in this documentation, as scribed in my presence, and it is both accurate and complete.    Elliot Dally Sheppard Evens,  MD, 01/15/2019, [place] Department of Emergency Medicine

## 2019-01-15 NOTE — ED Triage Notes (Signed)
Pt was brought in by PD for medical clearance. Pt has a painful red rash on her right forearm from IV drug use. PT denies any other injuries and has no other complaints

## 2019-01-15 NOTE — ED Nurses Note (Signed)
Pt is medically cleared per provider

## 2019-04-09 ENCOUNTER — Ambulatory Visit (INDEPENDENT_AMBULATORY_CARE_PROVIDER_SITE_OTHER): Payer: MEDICAID | Admitting: Family

## 2019-04-09 ENCOUNTER — Other Ambulatory Visit: Payer: MEDICAID | Attending: Family | Admitting: Family

## 2019-04-09 ENCOUNTER — Other Ambulatory Visit: Payer: Self-pay

## 2019-04-09 ENCOUNTER — Other Ambulatory Visit (INDEPENDENT_AMBULATORY_CARE_PROVIDER_SITE_OTHER): Payer: MEDICAID

## 2019-04-09 ENCOUNTER — Encounter (INDEPENDENT_AMBULATORY_CARE_PROVIDER_SITE_OTHER): Payer: Self-pay | Admitting: Family

## 2019-04-09 VITALS — BP 116/80 | HR 83 | Temp 97.5°F | Ht 64.0 in | Wt 174.0 lb

## 2019-04-09 DIAGNOSIS — F1991 Other psychoactive substance use, unspecified, in remission: Secondary | ICD-10-CM

## 2019-04-09 DIAGNOSIS — Z87898 Personal history of other specified conditions: Secondary | ICD-10-CM

## 2019-04-09 DIAGNOSIS — M79601 Pain in right arm: Secondary | ICD-10-CM

## 2019-04-09 DIAGNOSIS — R202 Paresthesia of skin: Secondary | ICD-10-CM | POA: Insufficient documentation

## 2019-04-09 DIAGNOSIS — Z8672 Personal history of thrombophlebitis: Secondary | ICD-10-CM | POA: Insufficient documentation

## 2019-04-09 DIAGNOSIS — M79602 Pain in left arm: Secondary | ICD-10-CM | POA: Insufficient documentation

## 2019-04-09 DIAGNOSIS — F329 Major depressive disorder, single episode, unspecified: Secondary | ICD-10-CM

## 2019-04-09 DIAGNOSIS — F419 Anxiety disorder, unspecified: Secondary | ICD-10-CM

## 2019-04-09 DIAGNOSIS — R748 Abnormal levels of other serum enzymes: Secondary | ICD-10-CM

## 2019-04-09 DIAGNOSIS — Z9851 Tubal ligation status: Secondary | ICD-10-CM

## 2019-04-09 DIAGNOSIS — F514 Sleep terrors [night terrors]: Secondary | ICD-10-CM | POA: Insufficient documentation

## 2019-04-09 HISTORY — DX: Personal history of thrombophlebitis: Z86.72

## 2019-04-09 HISTORY — DX: Tubal ligation status: Z98.51

## 2019-04-09 LAB — VITAMIN D 25 TOTAL: VITAMIN D 25, TOTAL: 26.8 ng/mL — ABNORMAL LOW (ref 30.00–100.00)

## 2019-04-09 LAB — COMPREHENSIVE METABOLIC PANEL, NON-FASTING
ALBUMIN: 4.6 g/dL (ref 3.5–5.0)
ALKALINE PHOSPHATASE: 61 U/L (ref 38–126)
ALT (SGPT): 207 U/L — ABNORMAL HIGH (ref <=35)
ANION GAP: 10 mmol/L
AST (SGOT): 152 U/L — ABNORMAL HIGH (ref 14–36)
BILIRUBIN TOTAL: 0.6 mg/dL (ref 0.2–5.0)
BUN/CREA RATIO: 20
BUN: 16 mg/dL (ref 7–17)
CALCIUM: 10 mg/dL (ref 8.4–10.2)
CHLORIDE: 102 mmol/L (ref 98–107)
CO2 TOTAL: 27 mmol/L (ref 22–30)
CREATININE: 0.82 mg/dL (ref 0.52–1.04)
ESTIMATED GFR: 95 mL/min/{1.73_m2} (ref >=60)
GLUCOSE: 86 mg/dL (ref 74–106)
POTASSIUM: 4.9 mmol/L (ref 3.5–5.1)
PROTEIN TOTAL: 7.7 g/dL (ref 6.3–8.2)
SODIUM: 139 mmol/L (ref 137–145)

## 2019-04-09 LAB — HEPATITIS A (HAV) IGG ANTIBODY: HAV IGG ANTIBODY: NEGATIVE

## 2019-04-09 LAB — LIPID PANEL
CHOLESTEROL: 208 mg/dL — ABNORMAL HIGH (ref 0–199)
HDL CHOL: 72 mg/dL — ABNORMAL HIGH (ref 40–60)
LDL CALC: 121 mg/dL (ref 0–130)
TRIGLYCERIDES: 74 mg/dL (ref 0–149)
VLDL CALC: 15 mg/dL

## 2019-04-09 LAB — HEPATITIS A (HAV) IGM ANTIBODY: HAV IGM: NEGATIVE

## 2019-04-09 LAB — HEPATITIS B SURFACE ANTIGEN: HBV SURFACE ANTIGEN QUALITATIVE: NEGATIVE

## 2019-04-09 LAB — THYROID STIMULATING HORMONE (SENSITIVE TSH): TSH: 1.2 u[IU]/mL (ref 0.465–4.680)

## 2019-04-09 LAB — IRON TRANSFERRIN AND TIBC
IRON (TRANSFERRIN) SATURATION: 26 % (ref 15–50)
IRON: 98 ug/dL (ref 37–170)
TOTAL IRON BINDING CAPACITY: 376 ug/dL (ref 265–497)

## 2019-04-09 LAB — CHLAMYDIA / NEISSERIA DNA BY PCR
CHLAMYDIA TRACHOMATIS PCR: NOT DETECTED
NEISSERIA GONORRHOEAE PCR: NOT DETECTED

## 2019-04-09 LAB — HEPATITIS C ANTIBODY SCREEN WITH REFLEX TO HCV PCR: HCV ANTIBODY QUALITATIVE: REACTIVE — AB

## 2019-04-09 LAB — HEPATITIS B CORE IGM, AB: HBV CORE IGM ANTIBODY QUALITATIVE: NEGATIVE

## 2019-04-09 LAB — HIV1/HIV2 SCREEN, COMBINED ANTIGEN AND ANTIBODY: HIV SCREEN, COMBINED ANTIGEN & ANTIBODY: NEGATIVE

## 2019-04-09 MED ORDER — BUSPIRONE 10 MG TABLET
10.00 mg | ORAL_TABLET | Freq: Two times a day (BID) | ORAL | 2 refills | Status: DC
Start: 2019-04-09 — End: 2019-04-15

## 2019-04-09 MED ORDER — BUPROPION HCL XL 150 MG 24 HR TABLET, EXTENDED RELEASE
150.00 mg | ORAL_TABLET | Freq: Every day | ORAL | 2 refills | Status: DC
Start: 2019-04-09 — End: 2019-06-03

## 2019-04-09 NOTE — Assessment & Plan Note (Signed)
Completed by Dr. Cliffton Asters.  Reminded patient that this long-term contraceptive method will not protect against STIs and patient will need to use a condom.
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
## 2019-04-09 NOTE — Assessment & Plan Note (Signed)
Chronic, uncontrolled problem  Patient is established with multiple counselors and will continue as scheduled  Will start SNRI today  Will also start BuSpar for anxiety as patient reports that was effective in the past  Discussed medication side effect profile  Will follow-up with me in 2 weeks for symptom recheck

## 2019-04-09 NOTE — Ancillary Notes (Signed)
Department of Community Practice     Venipuncture performed in office on RIGHT  arm antecubital vein, dry pressure dressing was applied to site and patient tolerated it well.  Specimen was centrifuged, aliquoted as needed and specimen was labeled and packaged for transport.    Jerelyn Scott, PHLEBOTOMIST  04/09/2019, 11:17

## 2019-04-09 NOTE — Assessment & Plan Note (Signed)
Chronic, controlled problem  Congratulated patient on her decision for recovery  She should continue to follow with the [facility] treatment center and continue medication as prescribed.  Encourage regular substance abuse counseling which patient is currently attending.  Patient will need hepatitis screening and full STI panel due to risk.

## 2019-04-09 NOTE — Assessment & Plan Note (Signed)
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

## 2019-04-10 ENCOUNTER — Other Ambulatory Visit (INDEPENDENT_AMBULATORY_CARE_PROVIDER_SITE_OTHER): Payer: Self-pay | Admitting: Family

## 2019-04-10 DIAGNOSIS — B1920 Unspecified viral hepatitis C without hepatic coma: Secondary | ICD-10-CM

## 2019-04-10 LAB — SYPHILIS, RAPID PLASMIN REAGIN (RPR), WITH TITER REFLEX: RPR QUALITATIVE: NONREACTIVE

## 2019-04-11 LAB — MAYO MISC TEST - REFRIG

## 2019-04-14 ENCOUNTER — Encounter (INDEPENDENT_AMBULATORY_CARE_PROVIDER_SITE_OTHER): Payer: Self-pay | Admitting: Family

## 2019-04-14 ENCOUNTER — Other Ambulatory Visit: Payer: Self-pay

## 2019-04-14 ENCOUNTER — Ambulatory Visit: Payer: MEDICAID | Attending: Family Medicine

## 2019-04-14 ENCOUNTER — Other Ambulatory Visit (HOSPITAL_BASED_OUTPATIENT_CLINIC_OR_DEPARTMENT_OTHER): Payer: Self-pay | Admitting: Family Medicine

## 2019-04-14 ENCOUNTER — Telehealth (INDEPENDENT_AMBULATORY_CARE_PROVIDER_SITE_OTHER): Payer: Self-pay | Admitting: Family

## 2019-04-14 DIAGNOSIS — Z79899 Other long term (current) drug therapy: Secondary | ICD-10-CM

## 2019-04-14 DIAGNOSIS — Z8619 Personal history of other infectious and parasitic diseases: Secondary | ICD-10-CM | POA: Insufficient documentation

## 2019-04-14 NOTE — Telephone Encounter (Signed)
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  We can monitor until she has an outbreak, and then treat, or she can start suppressive therapy.

## 2019-04-14 NOTE — Telephone Encounter (Addendum)
Pt was notified of results and recommendations and verbalized understanding. She would like to start suppressive therapy. Pharmacy is [facility] on [address].              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  We can monitor until she has an outbreak, and then treat, or she can start suppressive therapy.

## 2019-04-15 ENCOUNTER — Other Ambulatory Visit (INDEPENDENT_AMBULATORY_CARE_PROVIDER_SITE_OTHER): Payer: Self-pay | Admitting: Family

## 2019-04-15 DIAGNOSIS — F419 Anxiety disorder, unspecified: Secondary | ICD-10-CM

## 2019-04-15 MED ORDER — VALACYCLOVIR 1 GRAM TABLET
1000.00 mg | ORAL_TABLET | Freq: Every day | ORAL | 1 refills | Status: DC
Start: 2019-04-15 — End: 2019-07-21

## 2019-04-15 MED ORDER — BUSPIRONE 15 MG TABLET
15.00 mg | ORAL_TABLET | Freq: Three times a day (TID) | ORAL | 2 refills | Status: DC
Start: 2019-04-15 — End: 2019-08-12

## 2019-04-15 MED ORDER — BUSPIRONE 15 MG TABLET
15.00 mg | ORAL_TABLET | Freq: Three times a day (TID) | ORAL | 2 refills | Status: DC
Start: 2019-04-15 — End: 2019-04-15

## 2019-04-15 NOTE — Telephone Encounter (Signed)
Patient messaged in reporting that she spoke with the pharmacy and was told that an auth was needed. Contacted the pharmacy and they report that there is an interaction between Buspar and Valtrex. They are unable to tell me what it is because the rx was deleted. Requesting a new one be sent. Rx pending approval.

## 2019-04-15 NOTE — Telephone Encounter (Signed)
Please additional patient messages. Patient also requested increase in Buspar.

## 2019-04-15 NOTE — Telephone Encounter (Signed)
-----   Message from Kaitlin L. Czajka sent at 04/14/2019  6:24 PM EDT -----  Regarding: Prescription Question  Contact: [phone]  I was sopost to have a new medicine called in for my herpes that I was just diagnosed with an I've called the pharmacy an they don't have anything for me

## 2019-04-15 NOTE — Telephone Encounter (Signed)
Start suppressive therapy for herpes.

## 2019-04-15 NOTE — Telephone Encounter (Signed)
Buspar was increased.   Valtrex was already called in for patient.

## 2019-04-16 ENCOUNTER — Other Ambulatory Visit: Payer: Self-pay

## 2019-04-17 ENCOUNTER — Ambulatory Visit: Payer: MEDICAID | Attending: Family

## 2019-04-17 ENCOUNTER — Ambulatory Visit (INDEPENDENT_AMBULATORY_CARE_PROVIDER_SITE_OTHER): Payer: MEDICAID | Admitting: Family

## 2019-04-17 VITALS — BP 118/66 | HR 95 | Temp 98.0°F | Ht 64.0 in | Wt 181.8 lb

## 2019-04-17 DIAGNOSIS — M25562 Pain in left knee: Secondary | ICD-10-CM

## 2019-04-17 DIAGNOSIS — M25362 Other instability, left knee: Secondary | ICD-10-CM

## 2019-04-17 DIAGNOSIS — M25369 Other instability, unspecified knee: Secondary | ICD-10-CM
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
## 2019-04-22 LAB — ECG 12 LEAD
Atrial Rate: 73 {beats}/min
Calculated P Axis: 66 degrees
Calculated R Axis: 77 degrees
Calculated T Axis: 14 degrees
PR Interval: 126 ms
QRS Duration: 82 ms
QT Interval: 408 ms
QTC Calculation: 449 ms
Ventricular rate: 73 {beats}/min

## 2019-04-23 ENCOUNTER — Encounter (INDEPENDENT_AMBULATORY_CARE_PROVIDER_SITE_OTHER): Payer: Self-pay | Admitting: Family

## 2019-04-25 ENCOUNTER — Other Ambulatory Visit: Payer: Self-pay

## 2019-04-29 ENCOUNTER — Encounter (INDEPENDENT_AMBULATORY_CARE_PROVIDER_SITE_OTHER): Payer: Self-pay | Admitting: Family

## 2019-05-01 ENCOUNTER — Other Ambulatory Visit (INDEPENDENT_AMBULATORY_CARE_PROVIDER_SITE_OTHER): Payer: MEDICAID

## 2019-05-01 ENCOUNTER — Ambulatory Visit (INDEPENDENT_AMBULATORY_CARE_PROVIDER_SITE_OTHER): Payer: MEDICAID | Admitting: PHYSICIAN ASSISTANT

## 2019-05-01 ENCOUNTER — Other Ambulatory Visit: Payer: MEDICAID | Attending: INFECTIOUS DISEASE | Admitting: INFECTIOUS DISEASE

## 2019-05-01 ENCOUNTER — Encounter (INDEPENDENT_AMBULATORY_CARE_PROVIDER_SITE_OTHER): Payer: Self-pay | Admitting: PHYSICIAN ASSISTANT

## 2019-05-01 ENCOUNTER — Telehealth (INDEPENDENT_AMBULATORY_CARE_PROVIDER_SITE_OTHER): Payer: Self-pay | Admitting: Family

## 2019-05-01 ENCOUNTER — Other Ambulatory Visit: Payer: Self-pay

## 2019-05-01 VITALS — Ht 64.0 in | Wt 181.0 lb

## 2019-05-01 DIAGNOSIS — B1920 Unspecified viral hepatitis C without hepatic coma: Secondary | ICD-10-CM

## 2019-05-01 DIAGNOSIS — R932 Abnormal findings on diagnostic imaging of liver and biliary tract: Secondary | ICD-10-CM

## 2019-05-01 DIAGNOSIS — B182 Chronic viral hepatitis C: Secondary | ICD-10-CM

## 2019-05-01 DIAGNOSIS — M1712 Unilateral primary osteoarthritis, left knee: Secondary | ICD-10-CM

## 2019-05-01 DIAGNOSIS — M2392 Unspecified internal derangement of left knee: Secondary | ICD-10-CM

## 2019-05-01 DIAGNOSIS — M25562 Pain in left knee: Secondary | ICD-10-CM

## 2019-05-01 LAB — HEPATITIS B SURFACE ANTIBODY: HBV SURFACE ANTIBODY QUANTITATIVE: 793 m[IU]/mL — ABNORMAL HIGH (ref <8)

## 2019-05-01 LAB — HEPATITIS A (HAV) IGG ANTIBODY: HAV IGG ANTIBODY: NEGATIVE

## 2019-05-01 MED ORDER — MELOXICAM 7.5 MG TABLET
7.50 mg | ORAL_TABLET | Freq: Every day | ORAL | 1 refills | Status: DC
Start: 2019-05-01 — End: 2019-06-03

## 2019-05-01 NOTE — Ancillary Notes (Signed)
Department of Community Practice     Venipuncture performed in office on right  arm antecubital vein, dry pressure dressing was applied to site and patient tolerated it well.  Specimen was centrifuged, aliquoted as needed and specimen was labeled and packaged for transport.    Amanda Levine  05/01/2019, 07:49

## 2019-05-01 NOTE — Telephone Encounter (Signed)
Pt notified and verbalized understanding on results and recommendations. Results faxed to Dr. Perrin Smack and referral for ERCP sent to Dr. Rosita Kea [facility]) [name], [place]  05/01/2019, 13:20

## 2019-05-01 NOTE — Telephone Encounter (Signed)
-----   Message from Raoul Pitch, [place] sent at 05/01/2019 11:53 AM EDT -----  Please forward these results to Dr.Bage.     Liver is normal.     Bile duct is dilated.  Recommend ERCP evaluation -- I'm not sure which surgeons do them. Can you check and refer her to whomever is available.
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
## 2019-05-02 ENCOUNTER — Encounter (INDEPENDENT_AMBULATORY_CARE_PROVIDER_SITE_OTHER): Payer: Self-pay | Admitting: Nurse Practitioner

## 2019-05-02 ENCOUNTER — Encounter (INDEPENDENT_AMBULATORY_CARE_PROVIDER_SITE_OTHER): Payer: Self-pay | Admitting: Family

## 2019-05-02 LAB — HEPATITIS C VIRUS (HCV) RNA DETECTION AND QUANTIFICATION, PCR, PLASMA
HCV QUANTITATIVE PCR: 866000 IU/ML — ABNORMAL HIGH
HCV QUANTITATIVE RNA LOG: 5.94 LOG10 — ABNORMAL HIGH

## 2019-05-03 HISTORY — PX: LIVER BIOPSY: SHX301

## 2019-05-05 LAB — FIBROTEST-ACTITEST, SERUM
ActiTest Score: 0.95
Alanine Aminotransferase (ALT), S: 535 U/L — ABNORMAL HIGH (ref 7–45)
Alpha-2-Macroglobulin, S: 202 mg/dL (ref 100–280)
Apolipoprotein A1, S: 150 mg/dL (ref >=140)
Bilirubin, Total, S: 0.6 mg/dL (ref <=1.2)
BioPredictive Serial Number: 3374147
FibroTest Score: 0.19
Gamma Glutamyltransferase (GGT), S: 89 U/L — ABNORMAL HIGH (ref 5–36)
Haptoglobin, S: 129 mg/dL (ref 30–200)

## 2019-05-05 NOTE — H&P (Signed)
[facility], [facility]  [address]  [place] [address]  [phone]    History and Physical  Name: Amanda Levine  MRN: [id number]  DOB: Oct 13, 1985  Age: 34 y.o.  Date: 05/06/2019  Referring provider: Raoul Pitch, APRN,F*    Reason for Visit: Establish Care (Pt fatigue, LUQ pain, change in bowels, nausea)    History of Present Illness:  Amanda Levine is a 34 y.o. female who presents today for evaluation of abdominal pains, change in bowels, history of Crohn's.    04/2019 Fibrotest: F0/A3, HCV PCR 866000, HAV IgG-negative, HBsAb-reactive, HBsAg-negative, AST 152, ALT 207  Liver ultrasound: tiny adherent stones or gallbladder polyps without signs of cholecystitis, moderate dilation of CBD to 7mm, recommend MRCP vs ERCP vs CT, normal liver appearance    States she has been having LUQ pains, sharp, intermittent for the past few days. Worse in evenings and when she is more active. Will make her nauseous. No vomiting. Denies increase in belching, heartburn or reflux. No appetite loss or weight loss. States it stays right in that location.     Following with Dr. Perrin Smack for treatment of Hepatitis C. Goes back to them May 20th to hopefully get her approved for treatment. States the ultrasound was for the Hepatitis C, no symptoms.     States in 2016, was pregnant with her son and had a lot of abdominal pains. Found out she was pregnant. Then felt she was having pains d/t her gallbladder. Had a lot of issues with certain foods too to tolerate. States she had a colonoscopy done after she delivered him, and was told she had Crohn's disease. States she was to follow with GI for management and did not do this.     Bowel movements are very irregular. Sometimes can go a week without a BM, then other times, can go multiple times a day. This past week, passing bright red blood. Can see mucous as well at times. Has not been on any therapy at all.     Denies joint pains, skin rashes, eye redness, mouth sores.         Denies fevers, chills, nausea, vomiting, heartburn, reflux, melena, or unintentional weight loss.      Patient History:  Past Medical History:   Diagnosis Date    Anxiety     Crohn's colitis (CMS HCC)     Depression     Ectopic pregnancy     Elevated liver enzymes     History of intravenous drug use in remission     Viral hepatitis      Past Surgical History:   Procedure Laterality Date    COLONOSCOPY  2016    [place]    DILATION AND CURETTAGE, DIAGNOSTIC / THERAPEUTIC      HX CESAREAN SECTION      HX TONSILLECTOMY      HX TUBAL LIGATION       Current Outpatient Medications   Medication Sig    buPROPion (WELLBUTRIN XL) 150 mg extended release 24 hr tablet Take 1 Tablet (150 mg total) by mouth Once a day for 30 days    busPIRone (BUSPAR) 15 mg Oral Tablet Take 1 Tablet (15 mg total) by mouth Three times a day for 30 days    meloxicam (MOBIC) 7.5 mg Oral Tablet Take 1 Tablet (7.5 mg total) by mouth Once a day for 30 days    methadone (DOLOPHINE) 5 mg/5 mL Oral Solution Take 120 mg by mouth Once a  day     oxybutynin (DITROPAN) 5 mg Oral Tablet Take 5 mg by mouth Three times a day    valACYclovir (VALTREX) 1 gram Oral Tablet Take 1 Tablet (1 g total) by mouth Once a day for 90 days     Allergies   Allergen Reactions    Shellfish Derived Rash    Amoxicillin     Iv Contrast Seizure     Family Medical History:     Problem Relation (Age of Onset)    Cancer Sister    Depression Sister    Heart Attack Father, Maternal Grandfather    Hypertension (High Blood Pressure) Mother, Father    Liver Disease Sister        Social History     Socioeconomic History    Marital status: Legally Separated     Spouse name: Not on file    Number of children: Not on file    Years of education: Not on file    Highest education level: Not on file   Occupational History    Not on file   Tobacco Use    Smoking status: Current Every Day Smoker    Smokeless tobacco: Never Used   Vaping Use    Vaping Use: Every day    Substance and Sexual Activity    Alcohol use: Not Currently    Drug use: Yes     Types: Methamphetamines, Marijuana, Opioid     Comment: today     Sexual activity: Not on file   Other Topics Concern    Not on file   Social History Narrative    Not on file     Social Determinants of Health     Financial Resource Strain:     Difficulty of Paying Living Expenses:    Food Insecurity:     Worried About Charity fundraiser in the Last Year:     Arboriculturist in the Last Year:    Transportation Needs:     Film/video editor (Medical):     Lack of Transportation (Non-Medical):    Physical Activity:     Days of Exercise per Week:     Minutes of Exercise per Session:    Stress:     Feeling of Stress :    Intimate Partner Violence:     Fear of Current or Ex-Partner:     Emotionally Abused:     Physically Abused:     Sexually Abused:      Review of Systems:  General Positive for: Fatigue out of ordinary  General Negative for: Appetite Loss, Weight loss     ENT Negative for: Nose bleeds, Mouth sores, Sore throat     Respiratory Negative for: Coughing up blood, Wheezing, Nocturnal cough     Cardiovascular Negative for: Chest Pain, Leg edema, Indigestion  Gastronintestinal Positive for: Nausea, Abdominal pain, Change in bowl habits  Gastronintestinal Negative for: Heartburn, Vomiting, Difficulty swallowing, Black/Tarry stools     Genitourinary Negative for: Pelvic Pain, Vaginal discharge, Urinary frequency  All other review of systems negative.     Physical Exam:  BP 130/84    Ht 1.626 m (5\' 4" )    Wt 88.5 kg (195 lb)    BMI 33.47 kg/m     Physical Exam  Constitutional:       General: She is not in acute distress.     Appearance: She is not diaphoretic.   HENT:      Head: Normocephalic.  Eyes:      Pupils: Pupils are equal, round, and reactive to light.   Cardiovascular:      Rate and Rhythm: Normal rate and regular rhythm.      Heart sounds: Normal heart sounds. No murmur heard.     Pulmonary:       Effort: Pulmonary effort is normal. No respiratory distress.      Breath sounds: Normal breath sounds. No wheezing.   Abdominal:      General: Bowel sounds are normal. There is no distension.      Palpations: Abdomen is soft. There is no mass.      Tenderness: There is no abdominal tenderness. There is no guarding or rebound.   Musculoskeletal:         General: Normal range of motion.   Skin:     General: Skin is warm and dry.      Findings: No erythema or rash.   Neurological:      Mental Status: She is alert and oriented to person, place, and time.   Psychiatric:         Judgment: Judgment normal.         Assessment and Plan:    ICD-10-CM    1. Common bile duct dilatation  K83.8 CBC/DIFF     COMPREHENSIVE METABOLIC PANEL, NON-FASTING     PT/INR     TISSUE TRANSGLUTAMINASE (TTG) ANTIBODY, IGA, SERUM     ANA (ANTINUCLEAR ANTIBODIES), SERUM     MITOCHONDRIAL ANTIBODIES (M2), SERUM     SMOOTH MUSCLE ANTIBODIES, SERUM     MRI MRCP W/WO CONTRAST     HELICOBACTER PYLORI IGG     LIPASE   2. Elevated LFTs  R79.89 CBC/DIFF     COMPREHENSIVE METABOLIC PANEL, NON-FASTING     PT/INR     TISSUE TRANSGLUTAMINASE (TTG) ANTIBODY, IGA, SERUM     ANA (ANTINUCLEAR ANTIBODIES), SERUM     MITOCHONDRIAL ANTIBODIES (M2), SERUM     SMOOTH MUSCLE ANTIBODIES, SERUM     MRI MRCP W/WO CONTRAST     HELICOBACTER PYLORI IGG     LIPASE   3. Change in bowel function  R19.8 C-REACTIVE PROTEIN(CRP),INFLAMMATION     SEDIMENTATION RATE     CLOSTRIDIUM DIFFICILE TOXIN A/B     ROUTINE STOOL CULTURE (INCLUDING E. COLI SHIGA TOXIN)     CALPROTECTIN,FECAL   4. Crohn's disease (CMS HCC)  K50.90 C-REACTIVE PROTEIN(CRP),INFLAMMATION     SEDIMENTATION RATE     CLOSTRIDIUM DIFFICILE TOXIN A/B     ROUTINE STOOL CULTURE (INCLUDING E. COLI SHIGA TOXIN)     CALPROTECTIN,FECAL     At this time, will check further labwork and stool studies to evaluate symptoms. Will request previous colonoscopy to review. Discussed possible biologic therapy for Crohn's if true. Will  check MRCP and further labs to see if truly a CBD stone and if ERCP would be warranted. To consider repeating colonoscopy as well. Follow-up in 2-3 months, sooner if needed.       Tama Headings, FNP-C  05/05/2019, 10:40    This note may have been partially generated using MModal Fluency Direct system, and there may be some incorrect words, spellings, and punctuation that were not noted in checking the note before saving, though effort was made to avoid such errors.

## 2019-05-06 ENCOUNTER — Other Ambulatory Visit: Payer: Self-pay

## 2019-05-06 ENCOUNTER — Ambulatory Visit (HOSPITAL_BASED_OUTPATIENT_CLINIC_OR_DEPARTMENT_OTHER): Payer: MEDICAID

## 2019-05-06 ENCOUNTER — Ambulatory Visit: Payer: MEDICAID | Attending: Nurse Practitioner | Admitting: Nurse Practitioner

## 2019-05-06 ENCOUNTER — Encounter (INDEPENDENT_AMBULATORY_CARE_PROVIDER_SITE_OTHER): Payer: Self-pay | Admitting: Nurse Practitioner

## 2019-05-06 VITALS — BP 130/84 | Ht 64.0 in | Wt 195.0 lb

## 2019-05-06 DIAGNOSIS — K838 Other specified diseases of biliary tract: Secondary | ICD-10-CM

## 2019-05-06 DIAGNOSIS — R7989 Other specified abnormal findings of blood chemistry: Secondary | ICD-10-CM

## 2019-05-06 DIAGNOSIS — K5090 Crohn's disease, unspecified, without complications: Secondary | ICD-10-CM | POA: Insufficient documentation

## 2019-05-06 DIAGNOSIS — R198 Other specified symptoms and signs involving the digestive system and abdomen: Secondary | ICD-10-CM

## 2019-05-06 LAB — SEDIMENTATION RATE: ERYTHROCYTE SEDIMENTATION RATE (ESR): 11 mm/h (ref 0–20)

## 2019-05-06 LAB — COMPREHENSIVE METABOLIC PANEL, NON-FASTING
ALBUMIN: 3.8 g/dL (ref 3.5–5.0)
ALKALINE PHOSPHATASE: 87 U/L (ref 40–110)
ALT (SGPT): 316 U/L — ABNORMAL HIGH (ref 8–22)
ANION GAP: 2 mmol/L — ABNORMAL LOW (ref 4–13)
AST (SGOT): 157 U/L — ABNORMAL HIGH (ref 8–45)
BILIRUBIN TOTAL: 0.3 mg/dL (ref 0.3–1.3)
BUN/CREA RATIO: 17 (ref 6–22)
BUN: 12 mg/dL (ref 8–25)
CALCIUM: 8 mg/dL — ABNORMAL LOW (ref 8.5–10.0)
CHLORIDE: 109 mmol/L (ref 96–111)
CO2 TOTAL: 28 mmol/L (ref 22–30)
CREATININE: 0.7 mg/dL (ref 0.60–1.05)
ESTIMATED GFR: >90 mL/min/BSA (ref >=60)
GLUCOSE: 86 mg/dL (ref 65–125)
POTASSIUM: 4.3 mmol/L (ref 3.5–5.1)
PROTEIN TOTAL: 7.1 g/dL (ref 6.4–8.3)
SODIUM: 139 mmol/L (ref 136–145)

## 2019-05-06 LAB — CBC WITH DIFF
BASOPHIL #: <0.1 10*3/uL (ref <=0.20)
BASOPHIL %: 1 %
EOSINOPHIL #: 0.32 10*3/uL (ref <=0.50)
EOSINOPHIL %: 5 %
HCT: 37.2 % (ref 34.8–46.0)
HGB: 12.2 g/dL (ref 11.5–16.0)
IMMATURE GRANULOCYTE #: <0.1 10*3/uL (ref <0.10)
IMMATURE GRANULOCYTE %: 1 % (ref 0–1)
LYMPHOCYTE #: 2.72 10*3/uL (ref 1.00–4.80)
LYMPHOCYTE %: 39 %
MCH: 30.2 pg (ref 26.0–32.0)
MCHC: 32.8 g/dL (ref 31.0–35.5)
MCV: 92.1 fL (ref 78.0–100.0)
MONOCYTE #: 0.72 10*3/uL (ref 0.20–1.10)
MONOCYTE %: 10 %
MPV: 9.4 fL (ref 8.7–12.5)
NEUTROPHIL #: 3.2 10*3/uL (ref 1.50–7.70)
NEUTROPHIL %: 44 %
PLATELETS: 299 10*3/uL (ref 150–400)
RBC: 4.04 10*6/uL (ref 3.85–5.22)
RDW-CV: 14.6 % (ref 11.5–15.5)
WBC: 7.1 10*3/uL (ref 3.7–11.0)

## 2019-05-06 LAB — PT/INR
INR: 1 (ref <=5.00)
PROTHROMBIN TIME: 12 s (ref 9.7–13.6)

## 2019-05-06 LAB — LIPASE: LIPASE: 15 U/L (ref 10–60)

## 2019-05-06 LAB — HEPATITIS C VIRUS (HCV) GENOTYPE, PLASMA
HCV GENOTYPE 1A: DETECTED — AB
HEPATITIS C: DETECTED — AB

## 2019-05-06 LAB — HELICOBACTER PYLORI IGG: HELICOBACTER PYLORI IGG: NOT DETECTED

## 2019-05-06 LAB — C-REACTIVE PROTEIN(CRP),INFLAMMATION: CRP INFLAMMATION: 1.6 mg/L (ref <8.0)

## 2019-05-07 ENCOUNTER — Other Ambulatory Visit: Payer: Self-pay

## 2019-05-07 ENCOUNTER — Ambulatory Visit (INDEPENDENT_AMBULATORY_CARE_PROVIDER_SITE_OTHER): Payer: MEDICAID | Admitting: Family

## 2019-05-07 ENCOUNTER — Emergency Department
Admission: EM | Admit: 2019-05-07 | Discharge: 2019-05-07 | Payer: MEDICAID | Attending: PHYSICIAN ASSISTANT | Admitting: PHYSICIAN ASSISTANT

## 2019-05-07 ENCOUNTER — Encounter (HOSPITAL_COMMUNITY): Payer: Self-pay

## 2019-05-07 DIAGNOSIS — K5090 Crohn's disease, unspecified, without complications: Secondary | ICD-10-CM | POA: Insufficient documentation

## 2019-05-07 DIAGNOSIS — R109 Unspecified abdominal pain: Secondary | ICD-10-CM

## 2019-05-07 DIAGNOSIS — Z0289 Encounter for other administrative examinations: Secondary | ICD-10-CM | POA: Insufficient documentation

## 2019-05-07 DIAGNOSIS — Z880 Allergy status to penicillin: Secondary | ICD-10-CM

## 2019-05-07 DIAGNOSIS — B1920 Unspecified viral hepatitis C without hepatic coma: Secondary | ICD-10-CM | POA: Insufficient documentation

## 2019-05-07 DIAGNOSIS — F17200 Nicotine dependence, unspecified, uncomplicated: Secondary | ICD-10-CM | POA: Insufficient documentation

## 2019-05-07 DIAGNOSIS — Z8719 Personal history of other diseases of the digestive system: Secondary | ICD-10-CM | POA: Insufficient documentation

## 2019-05-07 DIAGNOSIS — Z008 Encounter for other general examination: Secondary | ICD-10-CM

## 2019-05-07 LAB — COMPREHENSIVE METABOLIC PANEL, NON-FASTING
ALBUMIN: 3.9 g/dL (ref 3.5–5.0)
ALKALINE PHOSPHATASE: 99 U/L (ref 40–110)
ALT (SGPT): 377 U/L — ABNORMAL HIGH (ref 8–22)
ANION GAP: 12 mmol/L (ref 4–13)
AST (SGOT): 256 U/L — ABNORMAL HIGH (ref 8–45)
BILIRUBIN TOTAL: 0.5 mg/dL (ref 0.3–1.3)
BUN/CREA RATIO: 14 (ref 6–22)
BUN: 11 mg/dL (ref 8–25)
CALCIUM: 9.3 mg/dL (ref 8.5–10.0)
CHLORIDE: 105 mmol/L (ref 96–111)
CO2 TOTAL: 25 mmol/L (ref 22–30)
CREATININE: 0.8 mg/dL (ref 0.60–1.05)
ESTIMATED GFR: >90 mL/min/BSA (ref >=60)
GLUCOSE: 102 mg/dL (ref 65–125)
POTASSIUM: 4.5 mmol/L (ref 3.5–5.1)
PROTEIN TOTAL: 7.6 g/dL (ref 6.4–8.3)
SODIUM: 142 mmol/L (ref 136–145)

## 2019-05-07 LAB — URINALYSIS, MACROSCOPIC
BILIRUBIN: NOT DETECTED mg/dL
BLOOD: NOT DETECTED mg/dL
GLUCOSE: NOT DETECTED mg/dL
KETONES: NOT DETECTED mg/dL
LEUKOCYTES: NOT DETECTED WBCs/uL
NITRITE: NOT DETECTED
PH: 6 (ref 5.0–8.0)
PROTEIN: NOT DETECTED mg/dL
SPECIFIC GRAVITY: 1.013 (ref 1.005–1.030)
UROBILINOGEN: NOT DETECTED mg/dL

## 2019-05-07 LAB — CBC WITH DIFF
BASOPHIL #: <0.04 10*3/uL (ref <=0.20)
BASOPHIL %: 0 %
EOSINOPHIL #: 0.32 10*3/uL (ref <=0.50)
EOSINOPHIL %: 4 %
HCT: 41.2 % (ref 34.8–46.0)
HGB: 13.3 g/dL (ref 11.5–16.0)
IMMATURE GRANULOCYTE #: <0.04 10*3/uL (ref <0.10)
IMMATURE GRANULOCYTE %: 0 % (ref 0–1)
LYMPHOCYTE #: 2.95 10*3/uL (ref 1.00–4.80)
LYMPHOCYTE %: 37 %
MCH: 29.3 pg (ref 26.0–32.0)
MCHC: 32.3 g/dL (ref 31.0–35.5)
MCV: 90.7 fL (ref 78.0–100.0)
MONOCYTE #: 0.79 10*3/uL (ref 0.20–1.10)
MONOCYTE %: 10 %
MPV: 9.1 fL (ref 8.7–12.5)
NEUTROPHIL #: 3.92 10*3/uL (ref 1.50–7.70)
NEUTROPHIL %: 49 %
PLATELETS: 323 10*3/uL (ref 150–400)
RBC: 4.54 10*6/uL (ref 3.85–5.22)
RDW-CV: 14.4 % (ref 11.5–15.5)
WBC: 8 10*3/uL (ref 3.7–11.0)

## 2019-05-07 LAB — HCG, URINE QUALITATIVE, PREGNANCY: HCG URINE QUALITATIVE: NOT DETECTED

## 2019-05-07 NOTE — ED Triage Notes (Signed)
Pt here for medical clearance for right sided abdominal pain from a stone she says she has. 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

## 2019-05-07 NOTE — ED Provider Notes (Signed)
Emergency Department  Provider Note  HPI - 05/07/2019    Name: Amanda Levine  Age and Gender: 34 y.o. female  Attending: Dr. Darlis Loan  APP: Ulis Rias PA-C    Covid-19 pandemic in effect.    Chief Complaint   Patient presents with    Abdominal Pain       HPI:  Amanda Levine is a 34 y.o. female  who presents to the Emergency Department today for medical clearance. Pt was escorted via PD today to be medically cleared for incarceration. Pt reports that she has a hx of blocked bile duct, hepatitis C, and elevated liver enzymes. She currently follows with Dr. Stanford Scotland and Dr. Carmie End. Pt is currently experiencing R sided abd pain and vaginal bleeding. She denies any known fevers, dyspnea, cough, CP, or urinary sxs. Pt rates her pain currently at a 7/10 and sxs are continuous here in the ED. Her LKMP is today. She appears to be in no acute distress with no obvious signs of any trauma seen. Pt has a known drug allergy to Amoxicillin and PCP is Fidela Juneau APRN. Additionally, pt has a hx of crohn's colitis and anxiety. She is also noted to be a current everyday smoker with admitted drug use. There are no other complaints or sxs at this time.        History provided HQ:[facility]     Review of Systems:    Constitutional: No fever, chills  Skin: No rashes or lesions  HENT: No sore throat or ear pain. Exhibits ability to handle own secretions adequately and airways are patent.  Eyes: No vision changes, redness, discharge  Cardio: No chest pain, palpitations   Respiratory/chest: No cough, wheezing or SOB  GI:+R sided abd pain.  No nausea or vomiting. No diarrhea or constipation.   GU:+Vaginal bleeding.  No dysuria, hematuria, polyuria  MSK: No joint pain.  No neck or back pain  Neuro: No numbness, tingling, or weakness.  No headache  All other systems reviewed and are negative, unless commented on in the HPI.     Below information reviewed with patient:   Current Outpatient Medications   Medication Sig    buPROPion  (WELLBUTRIN XL) 150 mg extended release 24 hr tablet Take 1 Tablet (150 mg total) by mouth Once a day for 30 days    busPIRone (BUSPAR) 15 mg Oral Tablet Take 1 Tablet (15 mg total) by mouth Three times a day for 30 days    meloxicam (MOBIC) 7.5 mg Oral Tablet Take 1 Tablet (7.5 mg total) by mouth Once a day for 30 days    methadone (DOLOPHINE) 5 mg/5 mL Oral Solution Take 120 mg by mouth Once a day     oxybutynin (DITROPAN) 5 mg Oral Tablet Take 5 mg by mouth Three times a day    valACYclovir (VALTREX) 1 gram Oral Tablet Take 1 Tablet (1 g total) by mouth Once a day for 90 days       Allergies   Allergen Reactions    Shellfish Derived Rash    Amoxicillin     Iv Contrast Seizure          Past Medical History:  Past Medical History:   Diagnosis Date    Anxiety     Crohn's colitis (CMS HCC)     Depression     Ectopic pregnancy     Elevated liver enzymes     History of intravenous drug use in remission  Viral hepatitis        Past Surgical History:  Past Surgical History:   Procedure Laterality Date    Colonoscopy  2016    Dilation and curettage, diagnostic / therapeutic      Hx cesarean section      Hx tonsillectomy      Hx tubal ligation         Social History:  Social History     Tobacco Use    Smoking status: Current Every Day Smoker    Smokeless tobacco: Never Used   Haematologist Use: Every day   Substance Use Topics    Alcohol use: Not Currently    Drug use: Yes     Types: Methamphetamines, Marijuana, Opioid     Comment: today      Social History     Substance and Sexual Activity   Drug Use Yes    Types: Methamphetamines, Marijuana, Opioid    Comment: today        Family History:  Family History   Problem Relation Age of Onset    Hypertension (High Blood Pressure) Mother     Heart Attack Father     Hypertension (High Blood Pressure) Father     Depression Sister     Cancer Sister     Liver Disease Sister     Heart Attack Maternal Grandfather          Old records reviewed.      Objective:  Nursing notes  reviewed    Filed Vitals:    05/07/19 1450   BP: (!) 147/92   Pulse: 78   Resp: 18   Temp: 36.6 C (97.8 F)   SpO2: 100%         Physical Exam  Nursing note and vitals reviewed.  Vital signs reviewed as above.     Estimated body mass index is 34.54 kg/m as calculated from the following:    Height as of this encounter: 1.6 m (5\' 3" ).    Weight as of this encounter: 88.5 kg (195 lb).    Constitutional: Pt is well-developed and well-nourished.   Head: Normocephalic and atraumatic.   Ears: TM's are nml. No erythema. No bulging.  Throat: Airways Patent. No trismus. No pharyngeal erythema.  Eyes: Conjunctivae are normal. Pupils are equal, round, and reactive to light. EOM are intact  Neck: Soft, supple, full range of motion.  Cardiovascular: RRR.  No Murmurs/rubs/gallops. Distal pulses present and equal bilaterally.  Pulmonary/Chest: Normal BS BL with no distress. No audible wheezes or crackles are noted.  GI/Abdominal: Soft, nontender, nondistended.  No rebound, guarding, or masses. Negative Murphy's, Mcburney's, Rovsing's, obturator or heel tap signs.  Musculoskeletal/Extremities: Normal range of motion. No deformities.  Exhibits no edema and no tenderness. DTR + 3/5. No saddle paraesthesia. Good pulse motor sensory distally.  Neurological: CNs 2-12 grossly intact.  No focal deficits noted.  Skin: Warm and dry. No rash or lesions  Psychiatric: Patient has a normal mood and affect.     Work-up:  Orders Placed This Encounter    CBC/DIFF    COMPREHENSIVE METABOLIC PANEL, NON-FASTING    CBC WITH DIFF    URINALYSIS, MACROSCOPIC    HCG, URINE QUALITATIVE, PREGNANCY        Labs:  No results found for this or any previous visit (from the past 24 hour(s)).    Abnormal Lab results:  Labs Reviewed   COMPREHENSIVE METABOLIC PANEL, NON-FASTING - Abnormal; Notable for the  following components:       Result Value    ALT (SGPT) 377 (*)     AST (SGOT)  256 (*)     All other components within normal limits    Narrative:     Lipemia can  alter results at this level (slight).                  Lipemia can alter results at this level (slight).                  Lipemia can alter results at this level (slight).   URINALYSIS, MACROSCOPIC - Normal   HCG, URINE QUALITATIVE, PREGNANCY - Normal   CBC/DIFF    Narrative:     The following orders were created for panel order CBC/DIFF.                  Procedure                               Abnormality         Status                                     ---------                               -----------         ------                                     CBC WITH [facility][[id number]]                                    Final result                                                 Please view results for these tests on the individual orders.   CBC WITH DIFF     Plan: Medical Records reviewed.    MDM:     During the patient's stay in the emergency department, medical record review was performed to assist with medical decision making and was reviewed by myself when available for review.   Patient remained stable throughout the emergency department course.    Advised to return to ED with any new, worsening, or concerning sxs.  Patient was given the opportunity to ask questions. All questions answered.  Patient demonstrated understanding and is agreeable to plan of discharge with appropriate follow up.   Discussed diagnosis and management including indications for emergent return, importance of close follow up and supportive care measures prior to patient discharge.           Impression:   Encounter Diagnosis   Name Primary?    Medical clearance for incarceration Yes     Disposition:  Discharged    It was advised that the patient return to the ED with any new, concerning or worsening symptoms and follow up as directed.   The patient verbalized understanding of all instructions and  had no further questions or concerns.     Follow up:   [facility] - Emergency Department  [address]  [place] [address]  [phone]    If symptoms worsen      I am scribing for, and in the presence of, Levie Heritage PA-C for services provided on 05/07/2019.  Hilma Favors, SCRIBE     [place], SCRIBE  05/07/2019, 14:45    I personally performed the services described in this documentation, as scribed in my presence, and it is both accurate and complete.   Levie Heritage, PA-C  The co-signing faculty was physically present in the emergency department and available for consultation and did not participate in the care of this patient.    Levie Heritage, PA-C  05/08/2019, 21:20

## 2019-05-08 LAB — ANA (ANTINUCLEAR ANTIBODIES), SERUM
ANTI-NUCLEAR ANTIBODIES,QUALITATIVE: POSITIVE — AB
ANTI-NUCLEAR ANTIBODIES,QUANTITATIVE: 3.79 {index_val} — ABNORMAL HIGH (ref <=0.90)

## 2019-05-09 LAB — TISSUE TRANSGLUTAMINASE (TTG) ANTIBODY, IGA, SERUM
TISSUE TRANSGLUTAMINASE ANTIBODIES IGA QUALITATIVE: NEGATIVE
TISSUE TRANSGLUTAMINASE ANTIBODIES IGA QUANTITATIVE: <0.5 U/mL (ref <15.0)

## 2019-05-12 ENCOUNTER — Ambulatory Visit (HOSPITAL_BASED_OUTPATIENT_CLINIC_OR_DEPARTMENT_OTHER): Payer: MEDICAID

## 2019-05-12 LAB — SMOOTH MUSCLE ANTIBODIES, SERUM

## 2019-05-12 LAB — ANA REFLEX
ANA FINAL INTERPRETATION: POSITIVE — AB
ANA TITER: 1:5120 {titer}

## 2019-05-14 ENCOUNTER — Encounter (INDEPENDENT_AMBULATORY_CARE_PROVIDER_SITE_OTHER): Payer: MEDICAID | Admitting: Family

## 2019-05-14 DIAGNOSIS — Z029 Encounter for administrative examinations, unspecified: Secondary | ICD-10-CM

## 2019-05-14 LAB — MITOCHONDRIAL ANTIBODIES (M2), SERUM
MITOCHONDRIAL ANTIBODIES, QUALITATIVE: NEGATIVE
MITOCHONDRIAL ANTIBODIES, QUANTITATIVE: 0.37 {index_val} (ref <0.91)

## 2019-05-14 NOTE — Progress Notes (Signed)
The patient did not appear for their appointment/or scheduled appointment was cancelled.  This office visit opened in error.

## 2019-05-20 ENCOUNTER — Encounter (INDEPENDENT_AMBULATORY_CARE_PROVIDER_SITE_OTHER): Payer: Self-pay

## 2019-05-20 ENCOUNTER — Encounter (INDEPENDENT_AMBULATORY_CARE_PROVIDER_SITE_OTHER): Payer: Self-pay | Admitting: Nurse Practitioner

## 2019-05-22 ENCOUNTER — Other Ambulatory Visit (INDEPENDENT_AMBULATORY_CARE_PROVIDER_SITE_OTHER): Payer: Self-pay | Admitting: Nurse Practitioner

## 2019-05-22 DIAGNOSIS — R7989 Other specified abnormal findings of blood chemistry: Secondary | ICD-10-CM

## 2019-05-22 DIAGNOSIS — R768 Other specified abnormal immunological findings in serum: Secondary | ICD-10-CM

## 2019-05-23 ENCOUNTER — Other Ambulatory Visit (INDEPENDENT_AMBULATORY_CARE_PROVIDER_SITE_OTHER): Payer: Self-pay | Admitting: Nurse Practitioner

## 2019-05-23 ENCOUNTER — Telehealth (INDEPENDENT_AMBULATORY_CARE_PROVIDER_SITE_OTHER): Payer: Self-pay | Admitting: Nurse Practitioner

## 2019-05-23 ENCOUNTER — Encounter (INDEPENDENT_AMBULATORY_CARE_PROVIDER_SITE_OTHER): Payer: Self-pay | Admitting: Family

## 2019-05-23 ENCOUNTER — Telehealth (INDEPENDENT_AMBULATORY_CARE_PROVIDER_SITE_OTHER): Payer: Self-pay | Admitting: Family

## 2019-05-23 DIAGNOSIS — R7989 Other specified abnormal findings of blood chemistry: Secondary | ICD-10-CM

## 2019-05-23 NOTE — Telephone Encounter (Signed)
-----   Message from Denia L. Arras sent at 05/23/2019  2:14 PM EDT -----  Regarding: RE: Prescription Question  Contact: [phone]  Theres no openings the rest of today or tomorrow that I could get in to see u is there?  I see u tuesday at 830 a.m for a pap but that's a long ways away when dealing with this particular issuse.

## 2019-05-23 NOTE — Telephone Encounter (Signed)
Pt notified of below recommendations and verbalzed understanding. Pt is scheduled for 05/27/19 @8 :30am      I would need to see this area to be able to treat it.  It is not caused by the Valtrex.

## 2019-05-23 NOTE — Telephone Encounter (Signed)
Called pts insurance for PA on pts CT liver biopsy, spoke with Gershon Cull A. PA is req`d, additional clinical information needed, faxed lab results, office visits to [phone]. Case Ref # [id number]    Amanda Lubinski, LPN

## 2019-05-24 ENCOUNTER — Ambulatory Visit (HOSPITAL_BASED_OUTPATIENT_CLINIC_OR_DEPARTMENT_OTHER): Payer: MEDICAID

## 2019-05-27 ENCOUNTER — Other Ambulatory Visit (HOSPITAL_COMMUNITY): Payer: MEDICAID | Admitting: Family

## 2019-05-27 ENCOUNTER — Ambulatory Visit
Admission: RE | Admit: 2019-05-27 | Discharge: 2019-05-27 | Disposition: A | Discharge status: Home / Self Care | Payer: MEDICAID | Source: Ambulatory Visit | Attending: PHYSICIAN ASSISTANT | Admitting: PHYSICIAN ASSISTANT

## 2019-05-27 ENCOUNTER — Ambulatory Visit (INDEPENDENT_AMBULATORY_CARE_PROVIDER_SITE_OTHER): Payer: MEDICAID | Admitting: Family

## 2019-05-27 ENCOUNTER — Other Ambulatory Visit (INDEPENDENT_AMBULATORY_CARE_PROVIDER_SITE_OTHER): Payer: MEDICAID

## 2019-05-27 ENCOUNTER — Encounter (INDEPENDENT_AMBULATORY_CARE_PROVIDER_SITE_OTHER): Payer: Self-pay | Admitting: Family

## 2019-05-27 ENCOUNTER — Other Ambulatory Visit: Payer: Self-pay

## 2019-05-27 VITALS — BP 146/88 | HR 105 | Temp 98.2°F | Ht 64.0 in | Wt 193.6 lb

## 2019-05-27 DIAGNOSIS — M2392 Unspecified internal derangement of left knee: Secondary | ICD-10-CM | POA: Insufficient documentation

## 2019-05-27 DIAGNOSIS — Z01419 Encounter for gynecological examination (general) (routine) without abnormal findings: Secondary | ICD-10-CM

## 2019-05-27 DIAGNOSIS — M25562 Pain in left knee: Secondary | ICD-10-CM

## 2019-05-27 DIAGNOSIS — M25462 Effusion, left knee: Secondary | ICD-10-CM | POA: Insufficient documentation

## 2019-05-27 DIAGNOSIS — S83242A Other tear of medial meniscus, current injury, left knee, initial encounter: Secondary | ICD-10-CM | POA: Insufficient documentation

## 2019-05-27 DIAGNOSIS — N898 Other specified noninflammatory disorders of vagina: Secondary | ICD-10-CM | POA: Insufficient documentation

## 2019-05-27 DIAGNOSIS — K644 Residual hemorrhoidal skin tags: Secondary | ICD-10-CM

## 2019-05-27 DIAGNOSIS — A5901 Trichomonal vulvovaginitis: Secondary | ICD-10-CM | POA: Insufficient documentation

## 2019-05-27 DIAGNOSIS — R7989 Other specified abnormal findings of blood chemistry: Secondary | ICD-10-CM

## 2019-05-27 LAB — PTT (PARTIAL THROMBOPLASTIN TIME): APTT: 24.6 s (ref 22.1–31.9)

## 2019-05-27 LAB — CHLAMYDIA / NEISSERIA DNA BY PCR
CHLAMYDIA TRACHOMATIS PCR: NOT DETECTED
NEISSERIA GONORRHOEAE PCR: NOT DETECTED

## 2019-05-27 LAB — PT/INR
INR: 0.99 (ref <=4.00)
PROTHROMBIN TIME: 9.8 s (ref 9.4–11.9)

## 2019-05-27 LAB — WETMOUNT
CLUE CELLS: ABSENT
TRICHOMONAS: ABSENT

## 2019-05-27 LAB — KOH PREP

## 2019-05-27 MED ORDER — HYDROCORTISONE 2.5 % TOPICAL CREAM WITH PERINEAL APPLICATOR
TOPICAL_CREAM | Freq: Two times a day (BID) | CUTANEOUS | 0 refills | Status: AC
Start: 2019-05-27 — End: 2019-06-03

## 2019-05-27 MED ORDER — CLOTRIMAZOLE 3 DAY 2 % VAGINAL CREAM
TOPICAL_CREAM | Freq: Every evening | VAGINAL | 0 refills | Status: AC
Start: 2019-05-27 — End: 2019-05-30

## 2019-05-27 NOTE — Nursing Note (Addendum)
Pt is here for annual pap. Her last cycle was 05/07/19 Pt c/o itchy yellow discharge.

## 2019-05-27 NOTE — Progress Notes (Signed)
PRIMARY CARE, [facility]  [address] [place] [address]    History and Physical     Name: Amanda Levine MRN:  [id number]   Date: 05/27/2019 Age: 34 y.o.           PCP: Raoul Pitch, APRN,FNP-BC     Reason for Visit: Annual Pap    History of Present Illness  Amanda Levine is a 34 y.o. female who is being seen today for Well Female Visit.     Patient is complaining of yellow vaginal discharge and external vaginal itching x 2 weeks. Has some burning with urination. Is not treating with anything OVER THE COUNTER. Also has a hemmorhoid and has had some bleeding with a bowel movement.      Social History     Substance and Sexual Activity   Sexual Activity Yes   . Partners: Male   . Birth control/protection: Female Sterilization     The current method of family planning is tubal ligation.    Review of Systems  Review of Systems   Constitutional: Negative for fever, night sweats, weight gain and weight loss.   Endocrine: Negative for cold intolerance and heat intolerance.   Skin: Negative for rash.   Gastrointestinal: Negative for bloating, abdominal pain, change in bowel habit, bowel incontinence, constipation, diarrhea, hemorrhoids, nausea and vomiting.   Genitourinary: Negative for bladder incontinence, decreased libido, dysuria, flank pain, frequency, genital sores, hematuria, incomplete emptying, menorrhagia, missed menses, nocturia, non-menstrual bleeding, pelvic pain and urgency.        Vaginal discharge - yellow.   External vaginal itching and burning.    Psychiatric/Behavioral: The patient does not have insomnia and is not nervous/anxious.        Past Medical History:   Diagnosis Date   . Anxiety    . Crohn's colitis (CMS HCC)    . Depression    . Ectopic pregnancy    . Elevated liver enzymes    . History of intravenous drug use in remission    . Viral hepatitis          Past Surgical History:   Procedure Laterality Date   . COLONOSCOPY  2016    [place]   . DILATION AND CURETTAGE,  DIAGNOSTIC / THERAPEUTIC     . HX CESAREAN SECTION     . HX TONSILLECTOMY     . HX TUBAL LIGATION             Medication:  buPROPion (WELLBUTRIN XL) 150 mg extended release 24 hr tablet, Take 1 Tablet (150 mg total) by mouth Once a day for 30 days  busPIRone (BUSPAR) 15 mg Oral Tablet, Take 1 Tablet (15 mg total) by mouth Three times a day for 30 days  meloxicam (MOBIC) 7.5 mg Oral Tablet, Take 1 Tablet (7.5 mg total) by mouth Once a day for 30 days  methadone (DOLOPHINE) 5 mg/5 mL Oral Solution, Take 120 mg by mouth Once a day   oxybutynin (DITROPAN) 5 mg Oral Tablet, Take 5 mg by mouth Three times a day  valACYclovir (VALTREX) 1 gram Oral Tablet, Take 1 Tablet (1 g total) by mouth Once a day for 90 days    No facility-administered medications prior to visit.    Allergies:  Allergies   Allergen Reactions   . Shellfish Derived Rash   . Amoxicillin    . Iv Contrast Seizure       Family Medical History:  Problem Relation (Age of Onset)    Cancer Sister    Depression Sister    Heart Attack Father, Maternal Grandfather    Hypertension (High Blood Pressure) Mother, Father    Liver Disease Sister          Social History     Tobacco Use   . Smoking status: Current Every Day Smoker   . Smokeless tobacco: Never Used   Vaping Use   . Vaping Use: Every day   Substance Use Topics   . Alcohol use: Not Currently   . Drug use: Yes     Types: Methamphetamines, Marijuana, Opioid     Comment: today      Social History     Social History Narrative   . Not on file       Nursing Notes:   [name], [place]  05/27/19 0093  Addendum  Pt is here for annual pap. Her last cycle was 05/07/19 Pt c/o itchy yellow discharge.      Physical Exam:  Vitals:    05/27/19 0826   BP: (!) 146/88   Pulse: (!) 105   Temp: 36.8 C (98.2 F)   SpO2: 98%   Weight: 87.8 kg (193 lb 9.6 oz)   Height: 1.626 m (5\' 4" )   BMI: 33.3      Physical Exam  Constitutional:       Appearance: Normal appearance.   Chest:      Breasts: Breasts are symmetrical.          Right: Normal. No inverted nipple, mass, nipple discharge, skin change or tenderness.         Left: Normal. No inverted nipple, mass, nipple discharge, skin change or tenderness.   Genitourinary:     General: Normal vulva.      Pubic Area: No rash.       Labia:         Right: No rash, tenderness, lesion or injury.         Left: No rash, tenderness, lesion or injury.       Urethra: No urethral swelling.      Vagina: Bleeding present. No vaginal discharge or lesions.      Cervix: Cervical bleeding present. No friability.      Uterus: Normal. Not enlarged and not tender.       Adnexa: Right adnexa normal and left adnexa normal.      Comments: Bilateral labia are red and edematous.   Lymphadenopathy:      Upper Body:      Right upper body: No axillary adenopathy.      Left upper body: No axillary adenopathy.      Lower Body: No right inguinal adenopathy. No left inguinal adenopathy.   Skin:     General: Skin is warm and dry.   Neurological:      Mental Status: She is alert and oriented to person, place, and time.   Psychiatric:         Mood and Affect: Mood normal.         Behavior: Behavior normal.       Assessment/Plan:  Problem List Items Addressed This Visit     None      Visit Diagnoses     Well female exam with routine gynecological exam    -  Primary  Pap collected today    Relevant Orders    CYTOPATHOLOGY, GYN +/- HIGH RISK HPV    Vaginal discharge  Acute, New Onset  Patient was Negative for Chlamydia in April but reports her partner has been unfaithful since then and she is concerned for possibl STI   Suspected external yeast - start topical antifungal    Relevant Orders    CHLAMYDIA / NEISSERIA DNA BY PCR    KOH PREP    WETMOUNT    External hemorrhoid      Acute, New Onset Problem  Avoid straining  Start hydrocortisone topical          Tobacco cessation counseling performed.       Follow up: No follow-ups on file.  Seek medical attention for new or worsening symptoms.  Patient has been seen in this clinic  within the last 3 years.     Raoul Pitch, APRN,FNP-BC          This note was partially created using MModal Fluency Direct system (voice recognition software) and is inherently subject to errors including those of syntax and "sound-alike" substitutions which may escape proofreading.  In such instances, original meaning may be extrapolated by contextual derivation.

## 2019-05-27 NOTE — Ancillary Notes (Signed)
Department of Community Practice     Venipuncture performed in office on right arm antecubital vein, dry pressure dressing was applied to site and patient tolerated it well.  Specimen was centrifuged, aliquoted as needed and specimen was labeled and packaged for transport.    Becky Augusta, PHLEBOTOMIST  05/27/2019, 08:57

## 2019-05-28 NOTE — Progress Notes (Signed)
Patient notified of results and recommendations. Surgery scheduled for 07/09/2019, office appointment on 07/03/2019

## 2019-05-29 ENCOUNTER — Other Ambulatory Visit: Payer: Self-pay

## 2019-05-29 ENCOUNTER — Ambulatory Visit
Admission: RE | Admit: 2019-05-29 | Discharge: 2019-05-29 | Disposition: A | Discharge status: Home / Self Care | Payer: MEDICAID | Source: Ambulatory Visit | Attending: Nurse Practitioner | Admitting: Nurse Practitioner

## 2019-05-29 DIAGNOSIS — R768 Other specified abnormal immunological findings in serum: Secondary | ICD-10-CM | POA: Insufficient documentation

## 2019-05-29 DIAGNOSIS — R7989 Other specified abnormal findings of blood chemistry: Secondary | ICD-10-CM | POA: Insufficient documentation

## 2019-05-29 DIAGNOSIS — K739 Chronic hepatitis, unspecified: Secondary | ICD-10-CM | POA: Insufficient documentation

## 2019-05-29 DIAGNOSIS — K7290 Hepatic failure, unspecified without coma: Secondary | ICD-10-CM | POA: Insufficient documentation

## 2019-05-29 LAB — CYTOPATHOLOGY, GYN +/- HIGH RISK HPV

## 2019-05-29 MED ORDER — LIDOCAINE (PF) 10 MG/ML (1 %) INJECTION SOLUTION
INTRAMUSCULAR | Status: AC
Start: 2019-05-29 — End: 2019-05-29
  Filled 2019-05-29: qty 30

## 2019-05-29 NOTE — Nurses Notes (Signed)
Post hour monitoring s/p liver biopsy completed. Pt vitals remained stable. Dr. Delford Field updated. Pt tolerated procedure well and had some anxiety prior. Band aid placed to site and no bleeding, sharp abdominal pain or SOB reported prior to discharge. Verbal and written discharge instructions given and pt and sister verbalized understanding of instructions.   Pt left department in stable condition.

## 2019-05-30 NOTE — Nurses Notes (Signed)
05/30/2019 1302  Called to check on pt. After liver biopsy yesterday. Pt. Stated was a little sore but otherwise ok. Denies any questions or concerns.

## 2019-06-02 ENCOUNTER — Other Ambulatory Visit (INDEPENDENT_AMBULATORY_CARE_PROVIDER_SITE_OTHER): Payer: Self-pay | Admitting: PHYSICIAN ASSISTANT

## 2019-06-02 ENCOUNTER — Encounter (INDEPENDENT_AMBULATORY_CARE_PROVIDER_SITE_OTHER): Payer: Self-pay | Admitting: Family

## 2019-06-02 DIAGNOSIS — M2392 Unspecified internal derangement of left knee: Secondary | ICD-10-CM

## 2019-06-02 DIAGNOSIS — M1712 Unilateral primary osteoarthritis, left knee: Secondary | ICD-10-CM

## 2019-06-03 ENCOUNTER — Encounter (INDEPENDENT_AMBULATORY_CARE_PROVIDER_SITE_OTHER): Payer: Self-pay | Admitting: Gastroenterology

## 2019-06-03 ENCOUNTER — Other Ambulatory Visit (INDEPENDENT_AMBULATORY_CARE_PROVIDER_SITE_OTHER): Payer: Self-pay | Admitting: Family

## 2019-06-03 DIAGNOSIS — F32A Depression, unspecified: Secondary | ICD-10-CM

## 2019-06-03 DIAGNOSIS — F419 Anxiety disorder, unspecified: Secondary | ICD-10-CM

## 2019-06-03 LAB — HUMAN PAPILLOMA VIRUS (HPV) BY PCR WITH HIGH RISK GENOTYPING (THINPREP)
HPV OTHER: NEGATIVE
HPV16 PCR: NEGATIVE
HPV18 PCR: NEGATIVE

## 2019-06-03 MED ORDER — BUPROPION HCL XL 150 MG 24 HR TABLET, EXTENDED RELEASE
300.00 mg | ORAL_TABLET | Freq: Every day | ORAL | 0 refills | Status: DC
Start: 2019-06-03 — End: 2019-09-22

## 2019-06-03 NOTE — Telephone Encounter (Signed)
Last given in April.  Thanks!

## 2019-06-04 ENCOUNTER — Ambulatory Visit
Payer: MEDICAID | Attending: Anatomic Pathology & Clinical Pathology | Admitting: Anatomic Pathology & Clinical Pathology

## 2019-06-04 DIAGNOSIS — K739 Chronic hepatitis, unspecified: Secondary | ICD-10-CM

## 2019-06-05 ENCOUNTER — Ambulatory Visit: Payer: MEDICAID | Attending: Family

## 2019-06-05 ENCOUNTER — Other Ambulatory Visit: Payer: Self-pay

## 2019-06-05 ENCOUNTER — Ambulatory Visit (INDEPENDENT_AMBULATORY_CARE_PROVIDER_SITE_OTHER): Payer: MEDICAID | Admitting: Internal Medicine

## 2019-06-05 DIAGNOSIS — Z710 Person encountering health services to consult on behalf of another person: Secondary | ICD-10-CM

## 2019-06-05 DIAGNOSIS — K732 Chronic active hepatitis, not elsewhere classified: Secondary | ICD-10-CM

## 2019-06-05 DIAGNOSIS — M79601 Pain in right arm: Secondary | ICD-10-CM

## 2019-06-05 DIAGNOSIS — M79602 Pain in left arm: Secondary | ICD-10-CM

## 2019-06-05 DIAGNOSIS — G5603 Carpal tunnel syndrome, bilateral upper limbs: Secondary | ICD-10-CM

## 2019-06-05 NOTE — Procedures (Signed)
Chief complaint:  The distal motor and sensory latencies of both median nerves are prolonged, maximal right and the forearm motor conduction velocities are normal.  Bilateral ulnar and radial conduction studies are normal.  EMG examination of the more symptomatic right arm is normal    Interpretation:  Mild bilateral carpal tunnel syndrome, maximal right

## 2019-06-09 ENCOUNTER — Other Ambulatory Visit (INDEPENDENT_AMBULATORY_CARE_PROVIDER_SITE_OTHER): Payer: Self-pay | Admitting: Family

## 2019-06-09 DIAGNOSIS — F329 Major depressive disorder, single episode, unspecified: Secondary | ICD-10-CM

## 2019-06-09 DIAGNOSIS — F419 Anxiety disorder, unspecified: Secondary | ICD-10-CM

## 2019-06-09 NOTE — Telephone Encounter (Signed)
Patient's Rx has changed.

## 2019-06-16 ENCOUNTER — Other Ambulatory Visit (INDEPENDENT_AMBULATORY_CARE_PROVIDER_SITE_OTHER): Payer: Self-pay | Admitting: Family

## 2019-06-16 DIAGNOSIS — F329 Major depressive disorder, single episode, unspecified: Secondary | ICD-10-CM

## 2019-06-16 DIAGNOSIS — F419 Anxiety disorder, unspecified: Secondary | ICD-10-CM

## 2019-06-17 ENCOUNTER — Other Ambulatory Visit (INDEPENDENT_AMBULATORY_CARE_PROVIDER_SITE_OTHER): Payer: Self-pay | Admitting: Family

## 2019-06-17 DIAGNOSIS — F419 Anxiety disorder, unspecified: Secondary | ICD-10-CM

## 2019-06-17 DIAGNOSIS — F32A Depression, unspecified: Secondary | ICD-10-CM

## 2019-06-17 NOTE — Telephone Encounter (Signed)
Last rx 06/03/19 #180 NRF  too soon refill Mike Gip, RN  06/17/2019, 08:38

## 2019-06-17 NOTE — Telephone Encounter (Signed)
Last rx 06/03/19 #180 NRF  too soon refill Mike Gip, RN  06/17/2019, 15:39

## 2019-06-18 ENCOUNTER — Encounter (INDEPENDENT_AMBULATORY_CARE_PROVIDER_SITE_OTHER): Payer: Self-pay | Admitting: Family

## 2019-06-18 ENCOUNTER — Telehealth (INDEPENDENT_AMBULATORY_CARE_PROVIDER_SITE_OTHER): Payer: Self-pay | Admitting: Family

## 2019-06-18 NOTE — Telephone Encounter (Signed)
Do you want pt to make an appointment or just drop off paperwork to complete?     Pharmacy did request refill for medication but it was sent in on 6/1/2 for a 90 day supply. Pt notified via MyChart message.     Mike Gip, RN  06/18/2019, 14:16

## 2019-06-18 NOTE — Telephone Encounter (Signed)
Pt notified of via MyChart message that per Shanda Bumps pt can bring in form and does not need an appointment. Also that rx was sent on 06/03/19 for a 90 day supply.

## 2019-06-18 NOTE — Telephone Encounter (Signed)
-----   Message from Margreat L. Bizzarro sent at 06/18/2019  1:44 PM EDT -----  Regarding: Visit Follow-Up Question  Contact: [phone]  My parole officer gave me a paper for my Amanda Levine to sign an fill out since im doing drug court ,how do i go about having her sign that do i need to make an appt an come in a00n see her or do i just bring it in . Also  i need to get a copie of my medical records  by the 26th to turn in as well how do i go about getting thos to. And i have a medican at the [employer] waiting on doctors authorization i keep clicking the option for then to contact u all but i guess they havent bc its still waiting for authorization.

## 2019-06-19 ENCOUNTER — Telehealth (INDEPENDENT_AMBULATORY_CARE_PROVIDER_SITE_OTHER): Payer: Self-pay | Admitting: Family

## 2019-06-19 ENCOUNTER — Other Ambulatory Visit (INDEPENDENT_AMBULATORY_CARE_PROVIDER_SITE_OTHER): Payer: Self-pay | Admitting: Family

## 2019-06-19 DIAGNOSIS — F419 Anxiety disorder, unspecified: Secondary | ICD-10-CM

## 2019-06-19 NOTE — Telephone Encounter (Signed)
-----   Message from Jadalyn L. Belt sent at 06/19/2019  8:49 AM EDT -----  Regarding: RE: Visit Follow-Up Question  Contact: [phone]  The pharmacy said they never recieved a refill for me yesterday. An asked me what medication it was an i tild them i wasnt sure that the nurse just had told me that Shanda Bumps my doctor sent in a refill an it shoulda been ready..they said they didnt no what i was talking about bc they never got anythi g fir me yesterday

## 2019-06-19 NOTE — Telephone Encounter (Signed)
I spoke with pharmacy, they require a medication over ride from insurance in order to fill medication due to dose increase and too soon to refill rx.     [place] medicaid contact and over ride completed.     Pharmacy notified by phone.     Pt notified via MyChart message.     Mike Gip, RN  06/19/2019, 11:12

## 2019-06-26 LAB — SURGICAL PATHOLOGY CONSULT

## 2019-06-27 ENCOUNTER — Other Ambulatory Visit (INDEPENDENT_AMBULATORY_CARE_PROVIDER_SITE_OTHER): Payer: Self-pay | Admitting: Rehabilitative and Restorative Service Providers"

## 2019-06-27 ENCOUNTER — Telehealth (INDEPENDENT_AMBULATORY_CARE_PROVIDER_SITE_OTHER): Payer: Self-pay | Admitting: Gastroenterology

## 2019-06-27 DIAGNOSIS — M2392 Unspecified internal derangement of left knee: Secondary | ICD-10-CM

## 2019-06-27 DIAGNOSIS — M1712 Unilateral primary osteoarthritis, left knee: Secondary | ICD-10-CM

## 2019-06-27 MED ORDER — MELOXICAM 7.5 MG TABLET
7.50 mg | ORAL_TABLET | Freq: Every day | ORAL | 1 refills | Status: AC
Start: 2019-06-27 — End: 2019-07-27

## 2019-06-27 NOTE — Telephone Encounter (Signed)
Noted  Contrell Ballentine, FNP-C

## 2019-06-27 NOTE — Telephone Encounter (Signed)
Pt called for a refill on Mobic.

## 2019-06-27 NOTE — Telephone Encounter (Signed)
Pt called in to give [place] an update on her appt w/Dr. Perrin Smack. Pt states that she is scheduled 07/17/19 @ 10:15.    Phylliss Bob, LPN 16/38/45 36:46

## 2019-06-30 ENCOUNTER — Other Ambulatory Visit (INDEPENDENT_AMBULATORY_CARE_PROVIDER_SITE_OTHER): Payer: Self-pay | Admitting: Family

## 2019-06-30 DIAGNOSIS — F329 Major depressive disorder, single episode, unspecified: Secondary | ICD-10-CM

## 2019-06-30 DIAGNOSIS — F419 Anxiety disorder, unspecified: Secondary | ICD-10-CM

## 2019-07-01 ENCOUNTER — Telehealth (INDEPENDENT_AMBULATORY_CARE_PROVIDER_SITE_OTHER): Payer: Self-pay | Admitting: Family

## 2019-07-01 ENCOUNTER — Ambulatory Visit
Admission: RE | Admit: 2019-07-01 | Discharge: 2019-07-01 | Disposition: A | Discharge status: Home / Self Care | Payer: MEDICAID | Source: Ambulatory Visit | Attending: Specialist | Admitting: Specialist

## 2019-07-01 ENCOUNTER — Encounter (HOSPITAL_COMMUNITY): Payer: Self-pay

## 2019-07-01 ENCOUNTER — Other Ambulatory Visit: Payer: Self-pay

## 2019-07-01 HISTORY — DX: Unspecified viral hepatitis C without hepatic coma: B19.20

## 2019-07-01 HISTORY — DX: Herpesviral gingivostomatitis and pharyngotonsillitis: B00.2

## 2019-07-01 HISTORY — DX: Unspecified convulsions (CMS HCC): R56.9

## 2019-07-01 HISTORY — DX: Personal history of urinary (tract) infections: Z87.440

## 2019-07-01 HISTORY — DX: Gilbert syndrome: E80.4

## 2019-07-01 HISTORY — DX: Sciatica, unspecified side: M54.30

## 2019-07-01 HISTORY — DX: Insomnia, unspecified: G47.00

## 2019-07-01 HISTORY — DX: Herpesviral infection of urogenital system, unspecified: A60.00

## 2019-07-01 HISTORY — DX: Papillomavirus as the cause of diseases classified elsewhere: B97.7

## 2019-07-01 NOTE — Nurses Notes (Signed)
Chart reviewed by S Simons APRN--no new orders

## 2019-07-03 ENCOUNTER — Ambulatory Visit (HOSPITAL_BASED_OUTPATIENT_CLINIC_OR_DEPARTMENT_OTHER): Payer: MEDICAID

## 2019-07-03 ENCOUNTER — Ambulatory Visit (INDEPENDENT_AMBULATORY_CARE_PROVIDER_SITE_OTHER): Payer: MEDICAID | Admitting: Specialist

## 2019-07-03 ENCOUNTER — Other Ambulatory Visit: Payer: Self-pay

## 2019-07-03 ENCOUNTER — Encounter (INDEPENDENT_AMBULATORY_CARE_PROVIDER_SITE_OTHER): Payer: Self-pay | Admitting: Specialist

## 2019-07-03 ENCOUNTER — Ambulatory Visit
Admission: RE | Admit: 2019-07-03 | Discharge: 2019-07-03 | Disposition: A | Discharge status: Home / Self Care | Payer: MEDICAID | Source: Ambulatory Visit | Attending: Specialist | Admitting: Specialist

## 2019-07-03 VITALS — Ht 64.0 in | Wt 190.0 lb

## 2019-07-03 DIAGNOSIS — Z01810 Encounter for preprocedural cardiovascular examination: Secondary | ICD-10-CM

## 2019-07-03 DIAGNOSIS — S83242A Other tear of medial meniscus, current injury, left knee, initial encounter: Secondary | ICD-10-CM

## 2019-07-03 DIAGNOSIS — Z01818 Encounter for other preprocedural examination: Secondary | ICD-10-CM

## 2019-07-03 DIAGNOSIS — S83512A Sprain of anterior cruciate ligament of left knee, initial encounter: Secondary | ICD-10-CM

## 2019-07-03 LAB — CBC WITH DIFF
BASOPHIL #: <0.1 10*3/uL (ref <=0.20)
BASOPHIL %: 1 %
EOSINOPHIL #: 0.31 10*3/uL (ref <=0.50)
EOSINOPHIL %: 4 %
HCT: 41.9 % (ref 34.8–46.0)
HGB: 13.4 g/dL (ref 11.5–16.0)
IMMATURE GRANULOCYTE #: <0.1 10*3/uL (ref <0.10)
IMMATURE GRANULOCYTE %: 0 % (ref 0–1)
LYMPHOCYTE #: 2.89 10*3/uL (ref 1.00–4.80)
LYMPHOCYTE %: 34 %
MCH: 30.5 pg (ref 26.0–32.0)
MCHC: 32 g/dL (ref 31.0–35.5)
MCV: 95.4 fL (ref 78.0–100.0)
MONOCYTE #: 0.96 10*3/uL (ref 0.20–1.10)
MONOCYTE %: 11 %
MPV: 9.6 fL (ref 8.7–12.5)
NEUTROPHIL #: 4.2 10*3/uL (ref 1.50–7.70)
NEUTROPHIL %: 50 %
PLATELETS: 339 10*3/uL (ref 150–400)
RBC: 4.39 10*6/uL (ref 3.85–5.22)
RDW-CV: 15 % (ref 11.5–15.5)
WBC: 8.4 10*3/uL (ref 3.7–11.0)

## 2019-07-03 LAB — BASIC METABOLIC PANEL
ANION GAP: 8 mmol/L (ref 4–13)
BUN/CREA RATIO: 16 (ref 6–22)
BUN: 12 mg/dL (ref 8–25)
CALCIUM: 9.1 mg/dL (ref 8.5–10.0)
CHLORIDE: 105 mmol/L (ref 96–111)
CO2 TOTAL: 24 mmol/L (ref 22–30)
CREATININE: 0.75 mg/dL (ref 0.60–1.05)
ESTIMATED GFR: >90 mL/min/BSA (ref >=60)
GLUCOSE: 90 mg/dL (ref 65–125)
POTASSIUM: 3.8 mmol/L (ref 3.5–5.1)
SODIUM: 137 mmol/L (ref 136–145)

## 2019-07-03 LAB — ECG 12 LEAD
Calculated P Axis: 57 degrees
Calculated R Axis: 82 degrees
QRS Duration: 82 ms
QT Interval: 400 ms
QTC Calculation: 464 ms
Ventricular rate: 81 {beats}/min
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
## 2019-07-09 ENCOUNTER — Ambulatory Visit (HOSPITAL_COMMUNITY): Payer: MEDICAID | Admitting: Anesthesiology

## 2019-07-09 ENCOUNTER — Ambulatory Visit (HOSPITAL_COMMUNITY): Payer: MEDICAID

## 2019-07-09 ENCOUNTER — Ambulatory Visit (HOSPITAL_COMMUNITY): Payer: MEDICAID | Admitting: Certified Registered"

## 2019-07-09 ENCOUNTER — Encounter (HOSPITAL_COMMUNITY)
Admission: RE | Disposition: A | Discharge status: Home / Self Care | Payer: Self-pay | Source: Ambulatory Visit | Attending: Specialist

## 2019-07-09 ENCOUNTER — Ambulatory Visit (HOSPITAL_COMMUNITY): Payer: MEDICAID | Admitting: Specialist

## 2019-07-09 ENCOUNTER — Ambulatory Visit (INDEPENDENT_AMBULATORY_CARE_PROVIDER_SITE_OTHER): Payer: Self-pay | Admitting: Specialist

## 2019-07-09 ENCOUNTER — Inpatient Hospital Stay
Admission: RE | Admit: 2019-07-09 | Discharge: 2019-07-09 | Disposition: A | Discharge status: Home / Self Care | Payer: MEDICAID | Source: Ambulatory Visit | Attending: Specialist | Admitting: Specialist

## 2019-07-09 DIAGNOSIS — M94262 Chondromalacia, left knee: Secondary | ICD-10-CM | POA: Insufficient documentation

## 2019-07-09 DIAGNOSIS — S83212A Bucket-handle tear of medial meniscus, current injury, left knee, initial encounter: Secondary | ICD-10-CM | POA: Insufficient documentation

## 2019-07-09 DIAGNOSIS — S83512A Sprain of anterior cruciate ligament of left knee, initial encounter: Secondary | ICD-10-CM | POA: Insufficient documentation

## 2019-07-09 DIAGNOSIS — S83242A Other tear of medial meniscus, current injury, left knee, initial encounter: Secondary | ICD-10-CM

## 2019-07-09 DIAGNOSIS — S83519A Sprain of anterior cruciate ligament of unspecified knee, initial encounter: Secondary | ICD-10-CM

## 2019-07-09 HISTORY — PX: KNEE SURGERY: SHX244

## 2019-07-09 LAB — URINE PREGNANCY, HCG: HCG URINE QUALITATIVE: NOT DETECTED

## 2019-07-09 SURGERY — ARTHROSCOPY KNEE WITH ANTERIOR CRUCIATE LIGAMENT RECONSTRUCTION BONE TO BONE
Anesthesia: General | Site: Knee | Laterality: Left | Wound class: Clean Wound: Uninfected operative wounds in which no inflammation occurred

## 2019-07-09 MED ORDER — DEXAMETHASONE SODIUM PHOSPHATE 4 MG/ML INJECTION SOLUTION
Freq: Once | INTRAMUSCULAR | Status: DC | PRN
Start: 2019-07-09 — End: 2019-07-09
  Administered 2019-07-09: 4 mg via INTRAVENOUS

## 2019-07-09 MED ORDER — SODIUM CHLORIDE 0.9 % (FLUSH) INJECTION SYRINGE
3.00 mL | INJECTION | Freq: Three times a day (TID) | INTRAMUSCULAR | Status: DC
Start: 2019-07-09 — End: 2019-07-09

## 2019-07-09 MED ORDER — DEXAMETHASONE SODIUM PHOSPHATE 4 MG/ML INJECTION SOLUTION
INTRAMUSCULAR | Status: AC
Start: 2019-07-09 — End: 2019-07-09
  Filled 2019-07-09: qty 1

## 2019-07-09 MED ORDER — KETOROLAC 30 MG/ML (1 ML) INJECTION SOLUTION
Freq: Once | INTRAMUSCULAR | Status: DC | PRN
Start: 2019-07-09 — End: 2019-07-09
  Administered 2019-07-09: 30 mg via INTRAVENOUS

## 2019-07-09 MED ORDER — RACEPINEPHRINE 2.25 % SOLUTION FOR NEBULIZATION
0.5000 mL | INHALATION_SOLUTION | Freq: Once | RESPIRATORY_TRACT | Status: DC | PRN
Start: 2019-07-09 — End: 2019-07-09

## 2019-07-09 MED ORDER — SODIUM CHLORIDE 0.9 % (FLUSH) INJECTION SYRINGE
3.00 mL | INJECTION | INTRAMUSCULAR | Status: DC | PRN
Start: 2019-07-09 — End: 2019-07-09

## 2019-07-09 MED ORDER — DIPHENHYDRAMINE 50 MG/ML INJECTION SOLUTION
Freq: Once | INTRAMUSCULAR | Status: DC | PRN
Start: 2019-07-09 — End: 2019-07-09
  Administered 2019-07-09: 25 mg via INTRAVENOUS

## 2019-07-09 MED ORDER — METOCLOPRAMIDE 5 MG/ML INJECTION SOLUTION
10.0000 mg | Freq: Once | INTRAMUSCULAR | Status: DC | PRN
Start: 2019-07-09 — End: 2019-07-09

## 2019-07-09 MED ORDER — CLINDAMYCIN 600 MG/50 ML IN 0.9% SODIUM CHLORIDE INTRAVENOUS PIGGYBACK
600.0000 mg | INJECTION | Freq: Once | INTRAVENOUS | Status: AC
Start: 2019-07-09 — End: 2019-07-09
  Administered 2019-07-09: 600 mg via INTRAVENOUS
  Filled 2019-07-09: qty 50

## 2019-07-09 MED ORDER — METOCLOPRAMIDE 5 MG/ML INJECTION SOLUTION
5.00 mg | INTRAMUSCULAR | Status: DC | PRN
Start: 2019-07-09 — End: 2019-07-09

## 2019-07-09 MED ORDER — CEFAZOLIN 2 GRAM/100 ML IN DEXTROSE(ISO-OSMOTIC) INTRAVENOUS PIGGYBACK
2.0000 g | INJECTION | Freq: Once | INTRAVENOUS | Status: DC
Start: 2019-07-09 — End: 2019-07-09
  Filled 2019-07-09: qty 100

## 2019-07-09 MED ORDER — BUPIVACAINE (PF) 0.5 % (5 MG/ML) INJECTION SOLUTION
INTRAMUSCULAR | Status: AC
Start: 2019-07-09 — End: 2019-07-09
  Filled 2019-07-09: qty 30

## 2019-07-09 MED ORDER — ONDANSETRON HCL (PF) 4 MG/2 ML INJECTION SOLUTION
INTRAMUSCULAR | Status: AC
Start: 2019-07-09 — End: 2019-07-09
  Filled 2019-07-09: qty 2

## 2019-07-09 MED ORDER — LACTATED RINGERS INTRAVENOUS SOLUTION
INTRAVENOUS | Status: DC
Start: 2019-07-09 — End: 2019-07-09

## 2019-07-09 MED ORDER — KETAMINE 30 MG/3 ML (10 MG/ML) IN SODIUM CHLOR,ISO-OSMOTIC INJ SYRINGE
INJECTION | INTRAMUSCULAR | Status: AC
Start: 2019-07-09 — End: 2019-07-09
  Filled 2019-07-09: qty 3

## 2019-07-09 MED ORDER — METHYLPREDNISOLONE ACETATE 40 MG/ML SUSPENSION FOR INJECTION
INTRAMUSCULAR | Status: AC
Start: 2019-07-09 — End: 2019-07-09
  Filled 2019-07-09: qty 1

## 2019-07-09 MED ORDER — DIPHENHYDRAMINE 50 MG/ML INJECTION SOLUTION
INTRAMUSCULAR | Status: AC
Start: 2019-07-09 — End: 2019-07-09
  Filled 2019-07-09: qty 1

## 2019-07-09 MED ORDER — FENTANYL (PF) 50 MCG/ML INJECTION SOLUTION
INTRAMUSCULAR | Status: AC
Start: 2019-07-09 — End: 2019-07-09
  Filled 2019-07-09: qty 2

## 2019-07-09 MED ORDER — FENTANYL (PF) 50 MCG/ML INJECTION SOLUTION
100.0000 ug | Freq: Once | INTRAMUSCULAR | Status: DC | PRN
Start: 2019-07-09 — End: 2019-07-09
  Administered 2019-07-09: 100 ug via INTRAVENOUS

## 2019-07-09 MED ORDER — EPINEPHRINE HCL (PF) 1 MG/ML (1 ML) INJECTION SOLUTION
INTRAMUSCULAR | Status: AC
Start: 2019-07-09 — End: 2019-07-09
  Filled 2019-07-09: qty 3

## 2019-07-09 MED ORDER — NALOXONE 1 MG/ML INJECTION SYRINGE
1.00 mg | INJECTION | INTRAMUSCULAR | Status: DC | PRN
Start: 2019-07-09 — End: 2019-07-09

## 2019-07-09 MED ORDER — SODIUM CHLORIDE 0.9 % IRRIGATION SOLUTION
Freq: Once | Status: DC | PRN
Start: 2019-07-09 — End: 2019-07-09
  Administered 2019-07-09: 7000 mL

## 2019-07-09 MED ORDER — OXYCODONE-ACETAMINOPHEN 5 MG-325 MG TABLET
1.00 | ORAL_TABLET | ORAL | 0 refills | Status: DC | PRN
Start: 2019-07-09 — End: 2019-08-28

## 2019-07-09 MED ORDER — LIDOCAINE HCL 20 MG/ML (2 %) INJECTION SOLUTION
Freq: Once | INTRAMUSCULAR | Status: DC | PRN
Start: 2019-07-09 — End: 2019-07-09
  Administered 2019-07-09: 100 mg

## 2019-07-09 MED ORDER — HYDROMORPHONE 0.5 MG/0.5 ML INJECTION SYRINGE
INJECTION | INTRAMUSCULAR | Status: AC
Start: 2019-07-09 — End: 2019-07-09
  Filled 2019-07-09: qty 0.5

## 2019-07-09 MED ORDER — ONDANSETRON HCL (PF) 4 MG/2 ML INJECTION SOLUTION
4.0000 mg | Freq: Once | INTRAMUSCULAR | Status: DC | PRN
Start: 2019-07-09 — End: 2019-07-09

## 2019-07-09 MED ORDER — HYDROMORPHONE 0.5 MG/0.5 ML INJECTION SYRINGE
0.5000 mg | INJECTION | INTRAMUSCULAR | Status: DC | PRN
Start: 2019-07-09 — End: 2019-07-09
  Administered 2019-07-09 (×2): 0.5 mg via INTRAVENOUS

## 2019-07-09 MED ORDER — KETOROLAC 30 MG/ML (1 ML) INJECTION SOLUTION
INTRAMUSCULAR | Status: AC
Start: 2019-07-09 — End: 2019-07-09
  Filled 2019-07-09: qty 1

## 2019-07-09 MED ORDER — PROPOFOL 10 MG/ML INTRAVENOUS EMULSION
INTRAVENOUS | Status: AC
Start: 2019-07-09 — End: 2019-07-09
  Filled 2019-07-09: qty 20

## 2019-07-09 MED ORDER — MIDAZOLAM 1 MG/ML INJECTION SOLUTION
2.0000 mg | Freq: Once | INTRAMUSCULAR | Status: DC | PRN
Start: 2019-07-09 — End: 2019-07-09
  Administered 2019-07-09: 2 mg via INTRAVENOUS

## 2019-07-09 MED ORDER — MIDAZOLAM 1 MG/ML INJECTION SOLUTION
INTRAMUSCULAR | Status: AC
Start: 2019-07-09 — End: 2019-07-09
  Filled 2019-07-09: qty 2

## 2019-07-09 MED ORDER — SODIUM CHLORIDE 0.9 % (FLUSH) INJECTION SYRINGE
20.0000 mL | INJECTION | Freq: Once | INTRAMUSCULAR | Status: DC | PRN
Start: 2019-07-09 — End: 2019-07-09

## 2019-07-09 MED ORDER — KETAMINE 30 MG/3 ML (10 MG/ML) IN SODIUM CHLOR,ISO-OSMOTIC INJ SYRINGE
INJECTION | Freq: Once | INTRAMUSCULAR | Status: DC | PRN
Start: 2019-07-09 — End: 2019-07-09
  Administered 2019-07-09: 30 mg via INTRAVENOUS

## 2019-07-09 MED ORDER — PROPOFOL 10 MG/ML IV BOLUS
INJECTION | Freq: Once | INTRAVENOUS | Status: DC | PRN
Start: 2019-07-09 — End: 2019-07-09
  Administered 2019-07-09: 200 mg via INTRAVENOUS

## 2019-07-09 MED ORDER — DEXAMETHASONE SODIUM PHOSPHATE 4 MG/ML INJECTION SOLUTION
Freq: Once | INTRAMUSCULAR | Status: DC | PRN
Start: 2019-07-09 — End: 2019-07-09
  Administered 2019-07-09: 4 mg

## 2019-07-09 MED ORDER — HYDROMORPHONE 0.5 MG/0.5 ML INJECTION SYRINGE
0.2500 mg | INJECTION | INTRAMUSCULAR | Status: DC | PRN
Start: 2019-07-09 — End: 2019-07-09

## 2019-07-09 MED ORDER — BUPIVACAINE (PF) 0.5 % (5 MG/ML) INJECTION SOLUTION
Freq: Once | INTRAMUSCULAR | Status: AC | PRN
Start: 2019-07-09 — End: 2019-07-09
  Administered 2019-07-09: 20 mL

## 2019-07-09 MED ORDER — OXYCODONE-ACETAMINOPHEN 5 MG-325 MG TABLET
1.00 | ORAL_TABLET | ORAL | Status: DC | PRN
Start: 2019-07-09 — End: 2019-07-09

## 2019-07-09 MED ORDER — HYDROMORPHONE 1 MG/ML INJECTION WRAPPER
1.00 mg | INJECTION | INTRAMUSCULAR | Status: DC | PRN
Start: 2019-07-09 — End: 2019-07-09

## 2019-07-09 MED ORDER — LACTATED RINGERS INTRAVENOUS SOLUTION
INTRAVENOUS | Status: DC | PRN
Start: 2019-07-09 — End: 2019-07-09

## 2019-07-09 MED ORDER — IPRATROPIUM 0.5 MG-ALBUTEROL 3 MG (2.5 MG BASE)/3 ML NEBULIZATION SOLN
3.0000 mL | INHALATION_SOLUTION | Freq: Once | RESPIRATORY_TRACT | Status: DC | PRN
Start: 2019-07-09 — End: 2019-07-09

## 2019-07-09 SURGICAL SUPPLY — 129 items
AIRWAY 80MM GDL PHRNG NONST LF  DISP GRN (AIR) ×1 IMPLANT
AIRWAY GUEDEL 80MM GREEN_3580EU 50EA/CS (AIR) ×1
APPL 70% ISPRP 2% CHG 26ML 13. 2X13.2IN CHLRPRP PREP DEHP-FR (WOUND CARE/ENTEROSTOMAL SUPPLY) ×2
APPL 70% ISPRP 2% CHG 26ML CHLRPRP HI-LT ORNG PREP STRL LF  DISP CLR (WOUND CARE SUPPLY) ×2 IMPLANT
BANDAGE ESMARK 9FTX6IN ELAS COMP BLU STRL LF (WOUND CARE SUPPLY) ×1 IMPLANT
BLADE 15 BD RB-BCK CBNSTL SURG TISS STRL LF  DISP (SURGICAL CUTTING SUPPLIES) ×1 IMPLANT
BLADE SAW 18.5X7MM OSC SGTL SS_THK.38MM MED NRW THN PREC 35 (CUTTING ELEMENTS) ×2
BLADE SAW 18.5X7MM OSCILLATE SGTL SS THK.38MM MED NRW THN PREC STRL LF  DISP (SURGICAL CUTTING SUPPLIES) ×1 IMPLANT
BLADE SHAVER 13CM 4MM COOLCUT TORPEDO 2 INNER CUT (SURGICAL CUTTING SUPPLIES) ×1 IMPLANT
BLADE SHAVER 13CM 4MM COOLCUT_TORPEDO 2 INR CUT (CUTTING ELEMENTS) ×2
BLADE SHAVER 13CM 4MM EXCLBR C_OOLCUT STRL DISP (CUTTING ELEMENTS) ×1
BLADE SHAVER 13CM 4MM EXCLBR C_OOLCUT STRL DISP (SURGICAL CUTTING SUPPLIES) ×1 IMPLANT
BLANKET MISTRAL-AIR ADULT UPR BODY 79X29.9IN FRC AIR HI VOL BLWR INTUITIVE CONTROL PNL LRG LED (MISCELLANEOUS PT CARE ITEMS) ×2 IMPLANT
BLANKET MISTRAL-AIR UPR BODY 7 8.7X29.9IN FRC AIR WARM (MISCELLANEOUS PT CARE ITEMS) ×2
BURR SHAVER 13CM 5MM COOLCUT 8 FLUTE OVAL STRL DISP (SURGICAL CUTTING SUPPLIES) ×1 IMPLANT
BURR SHAVER 13CM 5MM COOLCUT 8 FLUTE OVL STRL DISP (CUTTING ELEMENTS) ×1
CANNULA NASAL 7FT ANGL FLXB LI P PLATE CRSH RS LUM TUBE FLR (CANNULA) ×2
CANNULA NASAL 7FT ANGL FLXB LIP PLATE CRSH RS LUM TUBE FLR TIP ADULT ARLF UCIT STD CURVE LF  DISP (CANNULA) ×2 IMPLANT
CIRCUIT BREATHING 2 LIMB 2 FILTER BRTH MASK GAS SAMPLE LINE 33-87IN ADULT 3L LF  PORTEX DISP STR WYE (AIR) ×1 IMPLANT
CLOSURE SKIN STRIPS 1/2X4IN R1547 6/PK 50PK/BX (WOUND CARE/ENTEROSTOMAL SUPPLY) ×1
CONV USE 135507 - NEEDLE HYPO  18GA 1.5IN MAGELLAN SS BVL ORT SLF LEVEL SHEATH SFSHLD STD LL SYRG PNK STRL LF  DISP (NEEDLES & SYRINGE SUPPLIES) IMPLANT
CONV USE 306339 - GLOVE SURG 6 LTX 3 PLY PF BEAD_CUF SMTH STRL BRN TINT (GLOVES AND ACCESSORIES) ×1 IMPLANT
CONV USE 306396 - GLOVE SURG 8 LTX 3 PLY PF BEAD_CUF SMTH STRL BRN TINT (GLOVES AND ACCESSORIES) ×1 IMPLANT
CONV USE 405187 - CUFF TOURNIQUET BLU 30X4IN ATS 3K CYL 2 PORT BLADDER POS LOCK CONN SLEEVE STRL LF  DISP (ORTHOPEDICS (NOT IMPLANTS)) IMPLANT
CONV USE 405188 - CUFF TOURNIQUET BRN 34X4IN ATS 3K CYL 2 PORT 1 BLADDER POS LOCK CONN SLEEVE STRL LF  DISP (ORTHOPEDICS (NOT IMPLANTS)) ×1 IMPLANT
CONV USE ITEM 306391 - GLOVE SURG 7 LTX 3 PLY PF BEAD_CUF SMTH STRL BRN TINT (GLOVES AND ACCESSORIES) ×1 IMPLANT
CONV USE ITEM 306559- GLOVE SURG 7 LF SMTH BEAD CU_F STRL BLU 12IN PROTEXIS (GLOVES AND ACCESSORIES) ×1 IMPLANT
CONV USE ITEM 308892 - GLOVE SURG 7 LF PF BEAD CUF SMTH STRL BRN TINT 12IN (GLOVES AND ACCESSORIES) ×1
CONV USE ITEM 321833 - GLOVE SURG 7 LF PF BEAD CUF SMTH STRL BRN TINT 12IN (GLOVES AND ACCESSORIES) ×1 IMPLANT
CONV USE ITEM 321847 - GLOVE SURG 8.5 LTX 3 PLY PF BE_AD CUF SMTH STRL BRN TINT (GLOVES AND ACCESSORIES) ×2 IMPLANT
CONV USE ITEM 321853 - GLOVE SURG 8 LF SMTH BEAD CU_F STRL BLU 12IN PROTEXIS (GLOVES AND ACCESSORIES) ×1 IMPLANT
CONV USE ITEM 321855 - GLOVE SURG 6.5 LF SMTH BEAD_CUF STRL BLU 12IN PROTEXIS (GLOVES AND ACCESSORIES) ×2 IMPLANT
CONV USE ITEM 322001 - GLOVE SURG 8.5 LF SMTH BEAD_CUF STRL BLU 12IN PROTEXIS (GLOVES AND ACCESSORIES) ×1 IMPLANT
COVER REINF HVDTY LF DISP 90X 60IN TBL STRL SMS (EQUIPMENT MINOR) ×1
COVER TBL 90X60IN SMS REINF FNFLD STRL LF  DISP (EQUIPMENT MINOR) ×1 IMPLANT
CUFF TOURNIQUET 30IN X 4IN (ORTHOPEDICS (NOT IMPLANTS))
CUFF TOURNIQUET BLU 30X4IN ATS 3K CYL 2 PORT BLADDER POS LOCK CONN SLEEVE STRL LF  DISP (ORTHOPEDICS (NOT IMPLANTS))
CUFF TOURNIQUET BRN 34X4IN ATS 3K CYL 2 PORT 1 BLADDER POS LOCK CONN SLEEVE STRL LF  DISP (ORTHOPEDICS (NOT IMPLANTS)) ×1
Cannulated Drill 10mm ×2 IMPLANT
DEVICE POSITION SURG AST HLDR INSRT FOAM BLU LEG DISP (ORTHOPEDICS (NOT IMPLANTS)) ×1 IMPLANT
DISCONTINUED USE ITEM 319395 - SUTURE 4-0 FS2 VICRYL 27IN UNDYED BRD COAT ABS (SUTURE/WOUND CLOSURE) ×1 IMPLANT
DISCONTINUED USE ITEM 330109 - AIRWAY 4 CURVE AMBU ARNC PVC LRYNG MASK CUF CLR CD PCH PILT BAL PHRNG STRL LF  DISP 17.5MM 8.5MM 4ML (LMA) ×1 IMPLANT
DISCONTINUED USE ITEM 330118 - SUTURE 4-0 PS2 VICRYL MTPS 27IN UNDYED BRD COAT ABS (SUTURE/WOUND CLOSURE) ×1
DRAPE ADH 51X47IN STRDRP LF  STRL DISP SURG CLR (PROTECTIVE PRODUCTS/GARMENTS) ×1 IMPLANT
DRAPE SHEET 77X53IN .75 PRXM LF  STRL DISP SURG SMS BLU (MISCELLANEOUS PT CARE ITEMS) ×2 IMPLANT
DRAPE SHEET 77X53IN .75 PRXM L_STRL DISP SURG SMS BLU (MISCELLANEOUS PT CARE ITEMS) ×2
DRILL SURG FLIPCUTTER III (SURGICAL CUTTING SUPPLIES) ×2 IMPLANT
ELECTRODE ESURG BLADE PNCL 10FT EDGE STRL PVC DISP BUTTON SWH CORD HLSTR LF  ACPT 3/32IN STD SHAFT (CAUTERY SUPPLIES) ×1 IMPLANT
ELECTRODE PATIENT RTN 9FT VLAB C30- LB RM PHSV ACRL FOAM CORD NONIRRITATE NONSENSITIZE ADH STRP (CAUTERY SUPPLIES) ×1 IMPLANT
ELECTRODE PATIENT RTN 9FT VLAB_REM C30- LB PLHSV ACRL FOAM (CAUTERY SUPPLIES) ×1
FIX TIGHTROPE II FIBERTAPE BONE TEND BONE SYSTEM INTERNALBRACE ×1 IMPLANT
GARMENT COMPRESS MED CALF CENTAURA NYL VASOGRAD LTWT BRTHBL SEQ FIL BLU 18- IN (ORTHOPEDICS (NOT IMPLANTS)) ×1 IMPLANT
GARMENT COMPRESS MED CALF CENT_AURA NYL VASOGRAD LTWT BRTHBL (ORTHOPEDICS (NOT IMPLANTS)) ×1
GLOVE SURG 6 LTX 3 PLY PF BEAD CUF SMTH STRL BRN TINT (GLOVES AND ACCESSORIES) ×1
GLOVE SURG 6.5 LF SMTH BEAD_CUF STRL BLU 12IN PROTEXIS (GLOVES AND ACCESSORIES) ×2
GLOVE SURG 7 LF SMTH BEAD CU_F STRL BLU 12IN PROTEXIS (GLOVES AND ACCESSORIES) ×1
GLOVE SURG 7 LTX 3 PLY PF BEAD CUF SMTH STRL BRN TINT (GLOVES AND ACCESSORIES) ×1
GLOVE SURG 8 LTX 3 PLY PF BEAD_CUF SMTH STRL BRN TINT (GLOVES AND ACCESSORIES) ×1
GLOVE SURG 8.5 LF SMTH BEAD CUF STRL BLU 12IN PROTEXIS (GLOVES AND ACCESSORIES) ×1
GOWN SURG LRG AAMI L4 IMPRV SL_EEVE REINF BRTHBL STRL LF (PROTECTIVE PRODUCTS/GARMENTS) ×1
GOWN SURG LRG L4 IMPRV REINF BRTHBL STRL LF  DISP BLU AURR PE 43IN (PROTECTIVE PRODUCTS/GARMENTS) ×1 IMPLANT
GOWN SURG XL L4 IMPRV REINF BRTHBL STRL LF  DISP BLU AURR PE 47IN (PROTECTIVE PRODUCTS/GARMENTS) ×1 IMPLANT
HANDLE RIGID STRL LF  DISP DVN SURG LIGHT (SURGICAL INSTRUMENTS) ×2 IMPLANT
HANDLE RIGID STRL LF DISP DVN_SURG LIGHT (INSTRUMENTS) ×2
HANDPC SUCT MEDIVAC YANKAUER BLBS TIP CLR STRL LF  DISP (Suction) ×1 IMPLANT
HANDPC SUCT MEDIVAC YANKAUER B_LBS TIP CLR STRL LF DISP (Suction) ×1
INSTRUMENT SUC POOLE TIP 50180 DYND50180 50EA/CS (INSTRUMENTS) ×1
INSTRUMENT SUC POOLE TIP 50180_DYND50180 50EA/CS (INSTRUMENTS) ×1
Internal brace implant system ligament augmentatio ×1 IMPLANT
JELLY LUB PDI BCTRST H2O SOL N ONSTAIN NGRS STRL GLYC MTHY (INSTRUMENTS) ×2
JELLY LUB PDI BCTRST H2O SOL N ONSTAIN NGRS STRL GLYC MTHY (SURGICAL INSTRUMENTS) ×2 IMPLANT
KIT ARTHRO FIX INTERNALBRACE SWIVELOCK STD 4.75MM 3.5MM BIOCOMP RADOPQ (SUTURE/WOUND CLOSURE) ×1 IMPLANT
KIT INSTR ACL TRNTB DISP (ORTHOPEDICS (NOT IMPLANTS)) ×1 IMPLANT
MANIFLD SUCT NPTN 2 2 STD 4 PORT WASTE MGMT SYS NONST LF  DISP (Suction) ×1 IMPLANT
MASK AMBU LARYNGEAL SZ 4_321400000U 10EA/CS (LMA) ×1
NEEDLE FILTER 1.5IN 18GA BLUNT BVL LL HUB DEHP-FR BD STRL LF  PURP PLYCRB 5UM REG WL DISP (NEEDLES & SYRINGE SUPPLIES) ×1 IMPLANT
NEEDLE FLTR 1.5IN 18GA BLUNT B_VL LL HUB DEHP-FR BD STRL LF (NEEDLES & SYRINGE SUPPLIES) ×1
NEEDLE HYPO 18GA 1.5IN MONOJE CT MGLN SS SFSHLD BVL ORT SLF (NEEDLES & SYRINGE SUPPLIES)
NEEDLE NERVE STIM 4IN 20GA STPLX ULTRA 360 30D BVL INSL ECHGN EXT SET STRL LF (ANETHESIA SUPPLIES) ×1 IMPLANT
NEEDLE SPINAL PNK 3.5IN 18GA QUINCKE REG WL POLYPROP QUINCKE TIP STRL LF  DISP (ANETHESIA SUPPLIES) ×1 IMPLANT
NEEDLE SPINAL PNK 3.5IN 18GA Q_UINCKE STD LGTH BVL FIT STY (ANETHESIA SUPPLIES) ×1
PACK CUSTOM ARTHROSCOPY_DYNJ36228C 2EA/CS (CUSTOM TRAYS & PACK) ×1
PACK SURG CSTM ASCP LF (CUSTOM TRAYS & PACK) ×1 IMPLANT
PAD ABD 8 X 7 1/2IN STRL NON21453 240/CS (WOUND CARE/ENTEROSTOMAL SUPPLY) ×1
PAD ABD 8 X 7 1/2IN STRL_NON21453 240/CS (WOUND CARE SUPPLY) ×1 IMPLANT
PADDING CAST 4YDX6IN SYN STRL LF (CAST) ×2 IMPLANT
PMP TUBING 13FT CONT WV III DU_ALWAVE ARTHRO STRL DISP (ORTHOPEDICS (NOT IMPLANTS)) ×1
PROBE ESURG APOLLORF 90D ML PRT (SURGICAL INSTRUMENTS) ×1 IMPLANT
PUMP TUBING 13FT CONT WV III DUALWAVE ARTHRO STRL DISP (ORTHOPEDICS (NOT IMPLANTS)) ×1 IMPLANT
SCREW INTFR FASTTHREAD 9MM 20MM BIOCOMP KNEE ×1 IMPLANT
SET ADMIN 37IN 10 GTT/ML IV DUOVENT 2ND MED MALE LL ADPR HNGR DEHP STRL LF  CLRLNK (SOLUTIONS) ×1 IMPLANT
SET ADMIN 37IN 10 GTT/ML IV DU_OVENT 2ND MED MALE LL ADPR (SOLUTIONS) ×1
SOL IRRG 0.9% NACL 1000ML PLASTIC PR BTL ISTNC N-PYRG STRL LF (SOLUTIONS) IMPLANT
SOL IRRG 0.9% NACL 3L ARTHMTC LF (SOLUTIONS) ×2 IMPLANT
SOL IV VIAFLEX LR 1000ML PLASTIC CONTAINR LF (SOLUTIONS) ×2 IMPLANT
SOLUTION IRRG NS 2F7124 1000CC_12/CS (SOLUTIONS)
SOLUTION IV 0.9% IRRG 3000 ML_2B7477 (SOLUTIONS) ×2
SOLUTION IV LR 1000CC 2B2324X_14/CS (SOLUTIONS) ×2
SPONGE GAUZE 4X4IN AV GZ RYN P_LSTR 4 PLY ABS NONADH NWVN LF XX (WOUND CARE SUPPLY) ×1 IMPLANT
SPONGE GAUZE STRL 4 X 4IN TUB_6939 1280/CS (WOUND CARE SUPPLY) ×1 IMPLANT
SPONGE GAUZE STRL 4 X 4IN TUB_6939 1280/CS (WOUND CARE/ENTEROSTOMAL SUPPLY) ×1
SPONGE LAP 18 X 18IN STRL AL1818 5EA/PK 40PK/CS (WOUND CARE/ENTEROSTOMAL SUPPLY)
SPONGE LAP 18X18IN STRL (WOUND CARE SUPPLY) IMPLANT
STETH ESOPH 18FR TEMP SENSOR R EG TUBE OPTM SND 400 SER LF (ANETHESIA SUPPLIES) ×1
STETH ESOPH 18FR TEMP SENSOR REG TUBE THN DIST CUF POS LOCK CONN 400 SER STRL LF (ANETHESIA SUPPLIES) ×1 IMPLANT
STETH ESOPH 18FR TEMP SENSOR R_EG TUBE OPTM SND 400 SER LF (ANETHESIA SUPPLIES) ×1
STRIP 4X.5IN STRSTRP PLSTR REINF SKNCLS WHT STRL LF (WOUND CARE SUPPLY) ×1 IMPLANT
SUTURE 0 CT1 VICRYL 36IN UNDYE D BRD COAT ABS (SUTURE/WOUND CLOSURE) ×1
SUTURE 0 CT1 VICRYL 36IN UNDYED BRD COAT ABS (SUTURE/WOUND CLOSURE) ×1 IMPLANT
SUTURE 2 FIBERSTICK TIGERSTICK 50IN BLU 1 END STIFFEN BRD (OR) ×1
SUTURE 2 FIBERSTICK TIGERSTICK 50IN BLU 1 END STIFFEN BRD MONOF TIE NONAB 12IN (OR) ×1 IMPLANT
SUTURE 2 OS-4 ETHIBOND EXC 30IN GRN BRD NONAB (SUTURE/WOUND CLOSURE) IMPLANT
SUTURE 2 OS-4 ETHIBOND EXC 30I_N GRN BRD NONAB (SUTURE/WOUND CLOSURE)
SUTURE 2 V-37 ETHIBOND 30IN GR_N BRD 4 STRN NONAB (SUTURE/WOUND CLOSURE)
SUTURE 2 V-37 ETHIBOND EXC 30IN GRN BRD 4 STRN NONAB (SUTURE/WOUND CLOSURE) IMPLANT
SUTURE 2-0 CT2 VICRYL 27IN UNDYED BRD COAT ABS (SUTURE/WOUND CLOSURE) ×1 IMPLANT
SUTURE 4-0 FS2 VICRYL 27IN UND_YED BRD COAT ABS (SUTURE/WOUND CLOSURE) ×1
SUTURE 4-0 PS2 MONOCRYL MTPS 27IN UNDYED MONOF ABS (SUTURE/WOUND CLOSURE) ×1 IMPLANT
SUTURE 4-0 PS2 VICRYL MTPS 27IN UNDYED BRD COAT ABS (SUTURE/WOUND CLOSURE) ×1 IMPLANT
SUTURE 4-0 PS2 VICRYL MTPS 27I_N UNDYED BRD COAT ABS (SUTURE/WOUND CLOSURE) ×1
SUTURE 5 V-37 ETHIBOND 30IN GRN BRD 4 STRN NONAB (SUTURE/WOUND CLOSURE) IMPLANT
SYRINGE LL 10ML LF  STRL GRAD N-PYRG DEHP-FR PVC FREE MED DISP (NEEDLES & SYRINGE SUPPLIES) IMPLANT
SYRINGE LL 10ML LF STRL MED D_ISP (NEEDLES & SYRINGE SUPPLIES)
TIP SUCT YANKAUER PL STD RIGID LRG LUM BVL SLEEVE OPN STRL LF (SURGICAL INSTRUMENTS) ×1 IMPLANT
TOURNIQUET PORT SNGL CUFF 34IN_DISP 60707015600 (ORTHOPEDICS (NOT IMPLANTS)) ×1
TUBE SUCT CONNECT 1/4INX20_N620A 20EA/CS (Suction) ×1
TUBING SUCT CLR 20FT .25IN MEDIVAC MXGR RIGID MALE TO MALE CONN STRL LF  DISP (Suction) ×1 IMPLANT
WATER STRL 1000ML PLASTIC PR BTL LF (SOLUTIONS) IMPLANT
WATER STRL 1000ML PLASTIC PR B_TL LF (SOLUTIONS)

## 2019-07-09 NOTE — Anesthesia Transfer of Care (Signed)
ANESTHESIA TRANSFER OF CARE   Amanda Levine is a 34 y.o. ,female, Weight: 91.4 kg (201 lb 6.4 oz)   had Procedure(s):  LEFT KNEE ARTHROSCOPY WITH ANTERIOR CRUCIATE LIGAMENT RECONSTRUCTION BONE TO BONE  LEFT KNEE ARTHROSCOPY WITH PARTIAL MEDIAL MENISCECTOMY  performed  07/09/19   Primary Service: Wendi Snipes I*    Past Medical History:   Diagnosis Date   . Anxiety    . Convulsions (CMS HCC)     with IV contrast x 1   . Crohn's colitis (CMS HCC)    . Depression    . Ectopic pregnancy    . Elevated liver enzymes    . Genital herpes    . Gilbert's disease    . H/O urinary tract infection     1 month ago   . History of intravenous drug use in remission    . 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 Insomnia    . Oral herpes    . Sciatica    . Viral hepatitis    . Viral hepatitis C       Allergy History as of 07/09/19     AMOXICILLIN       Noted Status Severity Type Reaction    01/15/19 0408 Hamilton Capri, RN 01/15/19 Active             SHELLFISH DERIVED       Noted Status Severity Type Reaction    04/09/19 1028 [name], [place] 04/09/19 Active Medium  Rash          IV CONTRAST       Noted Status Severity Type Reaction    04/09/19 1029 [name], [place] 04/09/19 Active   Seizure          SHRIMP       Noted Status Severity Type Reaction    07/01/19 0838 Einar Pheasant, RN 07/01/19 Active                 I completed my transfer of care / handoff to the receiving personnel during which we discussed:  Access, Airway, All key/critical aspects of case discussed, Analgesia, Antibiotics, Expectation of post procedure, Fluids/Product, Gave opportunity for questions and acknowledgement of understanding, Labs and PMHx    Post Location: PACU                                                                  Last OR Temp: Temperature: 36.5 C (97.7 F)  ABG:  POTASSIUM   Date Value Ref Range Status   07/03/2019 3.8 3.5 - 5.1 mmol/L Final     KETONES   Date Value Ref Range Status   05/07/2019 Not Detected Not  Detected mg/dL Final     KOH PREP   Date Value Ref Range Status   05/27/2019 Yeast Present  Final     Comment:     SOURCE: VAGINAL     CALCIUM   Date Value Ref Range Status   07/03/2019 9.1 8.5 - 10.0 mg/dL Final     Calculated P Axis   Date Value Ref Range Status   07/03/2019 57 degrees Incomplete     Calculated R Axis   Date Value Ref Range Status  07/03/2019 82 degrees Incomplete     Calculated T Axis   Date Value Ref Range Status   07/03/2019 21 degrees Incomplete     Airway:* No LDAs found *  Blood pressure 111/67, pulse 95, temperature 36.5 C (97.7 F), resp. rate 13, height 1.626 m (5\' 4" ), weight 91.4 kg (201 lb 6.4 oz), SpO2 98 %, unknown if currently breastfeeding.

## 2019-07-09 NOTE — Nurses Notes (Signed)
Pt back from recovery room.  Pt is awake and alert no s/s of distress.  Pt has ace wrap to left knee it is clean, dry, and intact. Pt assisted out of bed she ambulated to bathroom and voided. Pt has support person with her.  Instructed them to use the call light for any needs. Both verbalized understanding of instructions.

## 2019-07-09 NOTE — Anesthesia Postprocedure Evaluation (Signed)
Anesthesia Post Op Evaluation    Patient: Amanda Levine  Procedure(s):  LEFT KNEE ARTHROSCOPY WITH ANTERIOR CRUCIATE LIGAMENT RECONSTRUCTION BONE TO BONE  LEFT KNEE ARTHROSCOPY WITH PARTIAL MEDIAL MENISCECTOMY    Last Vitals:Temperature: 36.2 C (97.2 F) (07/09/19 1501)  Heart Rate: 93 (07/09/19 1501)  BP (Non-Invasive): 125/78 (07/09/19 1501)  Respiratory Rate: 18 (07/09/19 1501)  SpO2: 99 % (07/09/19 1501)    No complications documented.    Patient is sufficiently recovered from the effects of anesthesia to participate in the evaluation and has returned to their pre-procedure level.  Patient location during evaluation: PACU       Patient participation: complete - patient participated  Level of consciousness: awake and alert and responsive to verbal stimuli  Multimodal Pain Management: Multimodal analgesia used between 6 hours prior to anesthesia start to PACU discharge  Pain score: 4  Pain management: adequate  Airway patency: patent    Anesthetic complications: no  Cardiovascular status: acceptable  Respiratory status: acceptable  Hydration status: acceptable  Patient post-procedure temperature: Pt Normothermic   PONV Status: Absent

## 2019-07-09 NOTE — H&P (Signed)
[facility]  H&P Update Form    Amanda Levine, Amanda Levine, 34 y.o. female  Date of Admission:  (Not on file)  Date of Birth:  1985-12-18    07/09/2019    STOP: IF H&P IS GREATER THAN 30 DAYS FROM SURGICAL DAY COMPLETE NEW H&P IS REQUIRED.     H & P updated the day of the procedure.  1.  H&P completed has been completed within 30 days of surgical procedure and is scanned into the media section of chart review. I have reviewed the H&P within 24 hours of the surgery, the patient has been examined, and no change has occured in the patients condition since the H&P was completed.        2.  Patient continues to be appropiate candidate for planned surgical procedure. YES    Alva Garnet, MD

## 2019-07-09 NOTE — Telephone Encounter (Signed)
[facility] called and stated that they wanted to make sure you knew patient was goes to the Methadone clinic and Percocet was prescribed. [facility] know if [facility] wanted to fill the Percocet?     Thanks  [place]

## 2019-07-09 NOTE — OR PostOp (Signed)
Pt from OR.  Respirations even and unlabored on 2L O2 via NC.  LR infusing to IV in RFA.  Dressing to left knee dry and intact.  Pedal pulse +2.  Toes warm and pink.  Ice pack placed.  Leg elevated.  Pt states that she is having some pain in her knee.  Will medicate for pain.  1248 - Dilaudid 0.5 mg IV given for pain.  1257 - Pt more awake.  O2 d/c.  1304 - Pt states pain is increased to a 10 on the scale.  Dilaudid 0.5 mg IV given for pan.  1330 -  Pt sitting in bed talking to me.  Pt more comfortable at this time.    1345 - Pt sitting in bed.  Respirations even and unlabored on room air.  LR infusing to IV in RFA.  Dressing to left knee dry and intact.  Pedal pulse +2.  Toes warm and pink.  Ice pack in place.  Pt states that pain is improved to a 6 on the scale.  Pt states this is tolerable at this time.  Pt awake and alert.

## 2019-07-09 NOTE — OR Surgeon (Signed)
[place]                                                  OPERATIVE NOTE    Patient Name: Amanda, Levine [facility] Number: [id number]  Date of Service: 07/09/2019   Date of Birth: Feb 22, 1985    All elements must be documented.  Pre-Operative Diagnosis:  1. Left anterior cruciate ligament deficient knee  2. Left medial meniscus tear  Post-Operative Diagnosis: same  Procedure(s)/Description:  Procedure(s):  Left - LEFT KNEE ARTHROSCOPY WITH ANTERIOR CRUCIATE LIGAMENT RECONSTRUCTION BONE TO BONE - Wound Class: Clean Wound: Uninfected operative wounds in which no inflammation occurred - Incision Closure: Deep and Superficial Layers  Left - LEFT KNEE ARTHROSCOPY WITH PARTIAL MEDIAL MENISCECTOMY - Wound Class: Clean Wound: Uninfected operative wounds in which no inflammation occurred - Incision Closure: Deep and Superficial Layers  Surgeon: Alva Garnet, MD  Assistant(s):  [employer]  Implants:   Implant Name Type Inv. Item Serial No. Manufacturer Lot No. LRB No. Used Action   DEVICE FIXATION TIGHTROPE BONE TENDON BONE SMALL L10 MM ACL 4 - [id number]  DEVICE FIXATION TIGHTROPE BONE TENDON BONE SMALL L10 MM ACL 4  [employer] [id number] Left 1 Implanted   SCREW INTFR FASTTHREAD 9MM 8M Cipriano Bunker KNEE - [id number]  SCREW INTFR FASTTHREAD 9MM 8M Cipriano Bunker KNEE  [employer] [id number] Left 1 Implanted   Internal brace implant system ligament augmentation repair biocomposite with jumpstart dressing    [employer] [id number] Left 1 Implanted     Specimens:  None  Anesthesia Type: General/Block    Estimated Blood Loss:  less than 845 ml    Complications (not routinely expected or not inherent to difficulty/nature of procedure): None    Findings:  See above, complex medial meniscus tear with large macerated bucket-handle fragment, complete tear of ACL    Description of the Procedure:  34 year old patient with history of pain and instability in her right knee.  Physical exam diagnostic studies were  consistent with a complete tear of the ACL as well as a complex bucket-handle medial meniscus tear.  After discussion treatment options she has decided proceed with ACL reconstruction and partial meniscectomy.  Procedures well as risks and alternatives were thoroughly covered the patient.  Risks include, but were not exclusive to, infection, bleeding, nerve damage, damage to bone surrounding soft tissues, continued pain, stiffness, thrombosis, need further surgery, anesthesia risks.  Patient voiced understanding proposed procedure, the risks and alternatives surgery desires to proceed.  Questions were answered her satisfaction.    Patient was brought to the operating room placed supine on the operative table.  After induction of general anesthesia she was positioned for the surgery with the arthroscopic leg holder and tourniquet around the upper left thigh.  The knee was examined under anesthesia, she had a 2+ Lachman's with no discernible endpoint and a positive pivot shift.  The right lower extremities well-padded, both allowed dangle free from the in the table.  The left lower extremity was sterilely prepped draped usual fashion from the tips of toes to the midthigh.  Complete comprehensive surgical time-out was performed, and the surgical site was confirmed.  The extremity was exsanguinated with an Esmarch bandage and the tourniquet inflated to 300 mm Hg.  The graft was initially harvested.  We chose a patellar tendon graft.  An incision was made at the tip of the patella extending to the tibial tubercle.  Dissection carried sharply through skin and subcutaneous tissues.  Hemostasis was obtained electrocautery.  The paratenon was incised and protected for later repair.  We then harvested the central 3rd of the patellar tendon with associated bone block from both the patella and the tibia.  30 mm by approximately 10 mm bone blocks were harvested.  This was taken to the back table and prepared by work [employer].  The bone blocks were shaped 10 mm diameters, the graft was assembled to the tight rope anchor.  Was measured and marked on the back table as well.  While this was being completed, arthroscopically evaluated knee.  Anterolateral arthroscopic portals created, the arthroscope placed using a blunt trocar for the arthroscopic cannula.  Diagnostic arthroscopy was then begun.  Evaluation of the patellofemoral articulation demonstrated no significant degenerative change in intact articular cartilage.  The medial gutter was noted for displaced fragment of the bucket-handle tear displaced into the medial gutter.  Medial compartment was notable for a macerated bucket-handle medial meniscus tear with separation of the bucket-handle in the mid section of the tear.  There is no possibility of repair.  From an anterior medial portal was a basket forceps and suction shaver to debride the meniscus back to a stable rim of cartilage.  This required essentially a subtotal medial meniscectomy.  The articular surfaces medially were in good condition.  Evaluation of the intercondylar notch demonstrated complete ACL tear.  PCL was intact.  Evaluation of lateral compartment showed no lateral meniscus tear, however early chondromalacia on the lateral femoral condyle and lateral tibial plateau were present.  No chondroplasty was necessary.  Then turned our attention back to the intercondylar notch.  Soft tissue and residual ACL fibers were removed.  The notch was enlarged to accommodate the ACL graft.  Once the notchplasty was complete, used a flip cutter to create a femoral socket.  The FlipCutter guide was positioned in the old ACL footprint on the femur, and the flip cutter cannula advanced down to bone.  We then drilled the flip cutter to this point, once satisfied with position, the femoral socket was then drilled with the FlipCutter.  After this was completed, we passed the passing suture through the femoral tunnel and captured  this of through the lateral portal.  The tibial tunnel was then created, the tibial tunnel guide was positioned against the PCL and the old ACL footprint on the tibia.  We then drilled a guide pin to the this position.  Once satisfied guide pin position, we overdrilled the guide pin with the 10 mm Reamer.  Bone graft was saved from the tibia for later incorporation in the patellar defect.  We then passed the passing suture through the tibial tunnel.  The graft was then advanced into the knee, initially deploying the tight rope anchor along the lateral femoral cortex.  We then guided the graft through the tibial tunnel into the femoral tunnel, tensioning the tight rope along the way.  The graft was then firmly secured with the tight rope anchor.  Once good femoral fixation was obtained, we then tensioned the graft through the tibial tunnel.  A Nitinol wire was passed anterior to the graft and then the tibial tunnel was tapped for a bio interference screw.  With good tension on the graft and holding the knee between 0 and 20 of flexion the interference screw was advanced into  the tibial tunnel obtained good purchase.  Solid fixation was noted.  We did using internal brace available on the tight rope anchor.  Tibial tunnel.  As the anchor was tightened, knee was held in extension to avoid over tensioning the internal brace.  After all this was completed, we checked an intraoperative Lachman's which was negative.  Solid endpoint noted.  The graft was observed through range of motion no significant role for lateral wall impingement was noted.  Good tension was present on the graft.  Then irrigated the knee thoroughly, the arthroscopic was removed.  The sutures were removed or cut appropriately.  We then closed the knee using 0 Vicryl for closure of the paratenon after bone grafting the patellar defect back.  Subcutaneous tissue closed with 2 O Vicryl, skin with a running 4 Monocryl.  Steri-Strips and a bulky postop  dressing were applied.  Patient taken to postanesthesia recovery in satisfactory condition.  Postoperative plan include standard post ACL protocol, weight-bearing as tolerated with crutches.    The above-mentioned physician assistant was present participated in all phases of the operation including exposure, bone preparation, implantation of prosthetic components, and wound closure.        Alva Garnet, MD

## 2019-07-09 NOTE — Anesthesia Preprocedure Evaluation (Signed)
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  No Facial hair  No Beard  No endotracheal tube present  No Tracheostomy present    Dental                    Pulmonary    Breath sounds clear to auscultation  (-) no rhonchi, no decreased breath sounds, no wheezes, no rales and no stridor     Cardiovascular    Rhythm: regular  Rate: Normal  (-) no friction rub, carotid bruit is not present, no peripheral edema and no murmur     Other findings            Plan  ASA 3     Planned anesthesia type: general LMA and regional block            Additional Plans: Pre-op Block  Intravenous induction     Anesthesia issues/risks discussed are: PONV, Post-op Pain Management, Local Anesthetic Systemic Toxicity, Aspiration, Intraoperative Awareness/ Recall, Stroke, Cardiac Events/MI and Failure of Block.  Anesthetic plan and risks discussed with patient.          Patient's NPO status is appropriate for Anesthesia.           Plan discussed with CRNA.

## 2019-07-09 NOTE — Nurses Notes (Signed)
Pt stable no change to left knee. Pt given crutches and she used them well.  Pt and support service person given discharge instructions and verbalized understanding of instructions.

## 2019-07-09 NOTE — Telephone Encounter (Signed)
Yes. She is going to need something for breakthrough pain.

## 2019-07-09 NOTE — Anesthesia Procedure Notes (Signed)
Block: Peripheral Block    Performed By:   Authorizing Provider:  Irving Copas, DO  Performing Provider:  Irving Copas, DO     Sedation  The patient was continuously monitored throughout the procedure and in recovery. I was in attendance and supervised the sedation (during the start and stop times listed below) and remained immediately available until the patient returned to pre-procedure baseline.  Irving Copas, DO 07/09/2019, 10:30  Sedation Start Time  07/09/2019 10:20 AM   Sedation Stop Time 07/09/2019 10:30 AM  Blocks  Laterality: Left Adductor femoral by anesthesia At Bedside  Type of Block: single shot  Ultrasound used for needle placement, imaging, supervision, and interpretation  Image:  images available in PACS  Diagnosis: knee pain   Indication: Requested by surgeon and Acute post operative pain   Pt location: at Bedside  Requesting Surgeon: Al Decant, MD  Site verified, H&P updated and consent obtained, Patient monitors applied, Timeout performed, Emergency drugs and equipment available, Patient positioned and anesthesia consent given  Technique(See MAR for doses)       Preprocedure hand washing was performed sterile field maintained          Skin prepped with: Chlorhexidine gluconate    Skin Local      Needle  Needle type: ultrasound needle     Needle Gauge: 20G   Needle length: 4 in.  Needle localization: ultrasound guidance  Number of attempts: 1      Catheter          Site      Medications  Medications:07/09/2019 10:31 AM  Bupivacaine PF (MARCAINE) 0.5% injection, 20 mL  Assessment  Injection assessment: incremental injection, local visualized surrounding nerve on ultrasound and negative aspiration for heme  Paresthesia pain: none    Heart Rate changed: No    Events     Patient tolerance of procedure: tolerated well, no immediate complications

## 2019-07-09 NOTE — Telephone Encounter (Signed)
I contacted pharmacy to let them know to go ahead and fill the percocet rx. Pharmacist stated that it will not go through insurance since she is on the Methadone that she will have to pay cash price. Pharmacist stated that Murdis cussed her out for having to pay $1.00 yesterday for acyclovir. Pharmacist stated that medicaid has a $1.00-$6.00 copay. Pharmacist asked me to call patient to let her know that she will have to pay $6.27 for her percocet. I contacted Racheal to let her know and she stated thank you very much.

## 2019-07-10 ENCOUNTER — Ambulatory Visit (INDEPENDENT_AMBULATORY_CARE_PROVIDER_SITE_OTHER): Payer: Self-pay | Admitting: Specialist

## 2019-07-10 NOTE — Telephone Encounter (Signed)
I contacted pt to let her know I printed out a medication list for her to pick up and that it will be upfront

## 2019-07-10 NOTE — Telephone Encounter (Signed)
Patient had surgery yesterday and needs a print out of all medication she was given. She has [facility] and they require all of this info. She has to go today @ 11:00 and give a urine smaole and needs this info before [phone]

## 2019-07-15 ENCOUNTER — Other Ambulatory Visit (INDEPENDENT_AMBULATORY_CARE_PROVIDER_SITE_OTHER): Payer: Self-pay | Admitting: Specialist

## 2019-07-15 NOTE — Telephone Encounter (Signed)
Pt states pain medication is causing nausea and vomiting.  Spoke with Dr. Myriam Forehand and her advised Phenergan 25 mg 1 PO q 4 hrs PRN nausea #25 no refills.  [facility].

## 2019-07-16 ENCOUNTER — Other Ambulatory Visit: Payer: Self-pay

## 2019-07-16 ENCOUNTER — Other Ambulatory Visit (INDEPENDENT_AMBULATORY_CARE_PROVIDER_SITE_OTHER): Payer: Self-pay | Admitting: Specialist

## 2019-07-16 ENCOUNTER — Ambulatory Visit (INDEPENDENT_AMBULATORY_CARE_PROVIDER_SITE_OTHER): Payer: MEDICAID | Admitting: Rehabilitative and Restorative Service Providers"

## 2019-07-16 DIAGNOSIS — Z9889 Other specified postprocedural states: Secondary | ICD-10-CM

## 2019-07-16 DIAGNOSIS — S83519A Sprain of anterior cruciate ligament of unspecified knee, initial encounter: Secondary | ICD-10-CM

## 2019-07-16 DIAGNOSIS — S83512A Sprain of anterior cruciate ligament of left knee, initial encounter: Secondary | ICD-10-CM

## 2019-07-16 NOTE — Progress Notes (Signed)
Physical Therapy, [facility]  [address]  [place] [address]  [phone]  Physical Therapy Progress Note       Patient Name: Amanda Levine  Date of Birth: 1985/04/26  Height:     Weight:         Subjective & Objective:   Subjective Evaluation    History of Present Illness  Mechanism of injury: Pt is s/p L ACL reconstruction w/ patella tendon bone to bone graft by Dr. Anthonette Legato on 07/09/19. Pt enters ambulating w/o AD w/ antalgic gait pattern. Reports spending a lot of time on her foot throughout the day. Pt had Left ACL reconstruction w/ partial menisectomy.     Pt reports instability in L knee for past several years.     Incision healing well, no drainage over past few days, mild bruising in L distal LE.   Reports pain is worse in the morning on first arising.     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   Insomnia    Oral herpes    Sciatica    Viral hepatitis    Viral hepatitis C   Past Surgical History:  Procedure Laterality Date   Colonoscopy  2016   Dilation and curettage, diagnostic / therapeutic     Ectopic pregnancy surgery     Hx cesarean section     Hx tonsillectomy     Hx tubal ligation     Liver biopsy        Quality of life: good    Pain  Current pain rating: 9  At best pain rating: 4  At worst pain rating: 10  Location: L knee, inferior anterior knee  Quality: dull ache and sharp  Relieving factors: medications, ice, relaxation and rest  Aggravating factors:  movement  Progression: improved    Social Support  Lives in: multiple-level home    Treatments  Current treatment: physical therapy  Patient Goals  Patient goals for therapy: decreased edema, decreased pain, improved balance, increased motion, independence with ADLs/IADLs, return to sport/leisure activities, return to work and increased strength            Assessment:   Objective     Observations   Left Knee   Positive for incision.     Additional Observation Details  Incision w/ mild redness around knee, two steri strips, ecchymosis in lower leg w/ rubor present    Passive Range of Motion   Left Knee   Flexion: 85 degrees   Extension: 10 degrees     Right Knee   Flexion: 130 degrees   Extension: 0 degrees     Strength/Myotome Testing     Right Knee   Flexion: 5  Extension: 5    Additional Strength Details  STRENGTH RIGHT, LEFT  hip flexion 5/5, 3+/5  abduction 5/5, 4/5  adduction 5/5, 4/5  knee extension 5/5,  not tested  knee flexion 5/5, not tested  ankle DF 5/5, 4/5   great toe extension 5/5, 4/5    Swelling     Left Knee Girth Measurement (cm)   Joint line: 47 cm  10 cm above joint line: 54 cm  10 cm below joint line: 43 cm    Ambulation     Ambulation: Level Surfaces     Additional Level Surfaces Ambulation Details  ambulates w/ antalgic gait pattern on LLE w/o AD, educated to use AD.        Plan:   Assessment & Plan     Assessment  Impairments: abnormal coordination, abnormal gait, abnormal muscle firing, abnormal muscle tone, abnormal or restricted ROM, activity intolerance, impaired balance, impaired physical strength, lacks appropriate home exercise program, pain with function and weight-bearing intolerance  Assessment details: Pt referred to PT treatment s/p ACL reconstruction using patella tendon graft. Pt presents ambulating w/ antalgic gait pattern, decreased ROM, decreased strength, and overall decline in functional mobility. Discussed POC and goals w/ pt. pt verbalizes understanding. Recommend using  crutches for weightbearing control to decrease pain. Pt will benefit from continued PT Intervention to promote return to prior level of function.   Prognosis: good    Goals  LTG's 12 weeks: 1. Pt will report pain 0-3/10 in L knee. 2. Pt will improve L knee strength 4+/5 for transfers. 3. Pt will improve L knee ROM to 0-135 degrees for transfers. 4. Pt will amb w/ normal gait pattern w/o AD 5. Pt will demo independence w/ HEP. 6. Pt will ascend/descend 12 stairs w/ reciprocal gait pattern    Plan  Therapy options: will be seen for skilled physical therapy services  Planned modality interventions: cryotherapy, electrical stimulation/Russian stimulation, TENS, thermotherapy (hydrocollator packs) and ultrasound  Planned therapy interventions: balance/weight-bearing training, bed mobility training, body mechanics training, flexibility, fine motor coordination training, functional ROM exercises, gait training, home exercise program, manual therapy, motor coordination training, muscle pump exercises, neuromuscular re-education, postural training, soft tissue mobilization, strengthening, stretching, transfer training and joint mobilization  Frequency: 3x week  Duration in weeks: 12  Treatment plan discussed with: patient        Continue to follow patient according to established plan of care.  The risks/benefits of therapy have been discussed with the patient/caregiver and he/she is in agreement with the established plan of care.       Therapist:   Fredrik Cove, PT, DPT  07/16/2019, 13:49     Start Time: 1350  End Time: 1430  Total Treatment Time: PT evaluation 40  minutes

## 2019-07-16 NOTE — Telephone Encounter (Signed)
Patient is s/p left knee arthroscopy with ACL reconstruction bone to bone.  Left knee partial medial menisectomy.      Refill on percocet #30 tablets.    Last refill 07/09/19.    Denton Meek, MA

## 2019-07-17 ENCOUNTER — Other Ambulatory Visit (INDEPENDENT_AMBULATORY_CARE_PROVIDER_SITE_OTHER): Payer: Self-pay

## 2019-07-17 ENCOUNTER — Telehealth (INDEPENDENT_AMBULATORY_CARE_PROVIDER_SITE_OTHER): Payer: Self-pay | Admitting: Specialist

## 2019-07-17 DIAGNOSIS — S83519A Sprain of anterior cruciate ligament of unspecified knee, initial encounter: Secondary | ICD-10-CM | POA: Insufficient documentation

## 2019-07-17 HISTORY — DX: Sprain of anterior cruciate ligament of unspecified knee, initial encounter: S83.519A

## 2019-07-17 MED ORDER — OXYCODONE-ACETAMINOPHEN 5 MG-325 MG TABLET
1.00 | ORAL_TABLET | ORAL | 0 refills | Status: AC | PRN
Start: 2019-07-17 — End: ?

## 2019-07-17 NOTE — Telephone Encounter (Signed)
Pt called in asking about rx, rx was approved and sent to pharmacy, let pt know    Sigurd Sos, MA

## 2019-07-17 NOTE — Telephone Encounter (Signed)
A user error has taken place: staff member opened encounter in error..

## 2019-07-17 NOTE — Telephone Encounter (Signed)
Patient aware, she is aware we will have to call and get an overide everytime we send a refill in. Tresa Res, [place]

## 2019-07-17 NOTE — Progress Notes (Signed)
Transferred med from outside source where rx was called in    [place], [place]

## 2019-07-17 NOTE — Telephone Encounter (Signed)
Patient called in and stated that she spoke with pharmacy and her script was on hold due to needing a prior auth on this, she states they were to send over a fax to [place], i let her know as soon as we got it we would take care of it, i spoke to christ at [facility], he did an over ride that was needed due to her gettting methadone as well. Tresa Res, [place]

## 2019-07-20 ENCOUNTER — Other Ambulatory Visit (INDEPENDENT_AMBULATORY_CARE_PROVIDER_SITE_OTHER): Payer: Self-pay | Admitting: Family

## 2019-07-20 DIAGNOSIS — Z8619 Personal history of other infectious and parasitic diseases: Secondary | ICD-10-CM

## 2019-07-20 LAB — ECG 12 LEAD
Atrial Rate: 81 {beats}/min
Calculated T Axis: 21 degrees
PR Interval: 124 ms

## 2019-07-21 NOTE — Telephone Encounter (Signed)
Pharmacy requesting refill.     Last scheduled appointment with you was 04/17/2019.  Currently scheduled future appointment is Visit date not found.    Mike Gip, RN  07/21/2019, 09:00

## 2019-07-22 ENCOUNTER — Ambulatory Visit (INDEPENDENT_AMBULATORY_CARE_PROVIDER_SITE_OTHER): Payer: MEDICAID | Admitting: PHYSICIAN ASSISTANT

## 2019-07-22 ENCOUNTER — Other Ambulatory Visit (INDEPENDENT_AMBULATORY_CARE_PROVIDER_SITE_OTHER): Payer: Self-pay | Admitting: Specialist

## 2019-07-22 ENCOUNTER — Other Ambulatory Visit: Payer: Self-pay

## 2019-07-22 ENCOUNTER — Encounter (INDEPENDENT_AMBULATORY_CARE_PROVIDER_SITE_OTHER): Payer: Self-pay | Admitting: PHYSICIAN ASSISTANT

## 2019-07-22 DIAGNOSIS — Z4889 Encounter for other specified surgical aftercare: Secondary | ICD-10-CM

## 2019-07-22 DIAGNOSIS — S83512D Sprain of anterior cruciate ligament of left knee, subsequent encounter: Secondary | ICD-10-CM

## 2019-07-22 NOTE — Progress Notes (Signed)
[facility]  [place]  [place] [address]    Postop note    Name: Amanda Levine MRN:  [id number]   Date: 07/22/2019 Age: 34 y.o.         Chief Complaint:  2 week postop visit    HPI: Amanda Levine [birth year] is a 34 y.o. female presenting for routine postoperative visit status post left knee arthroscopy with anterior cruciate ligament reconstruction, bone to bone, and partial medial meniscectomy by Dr. Anthonette Legato on 07/09/2019 at [facility].  Patient has started physical therapy.  She complains of normal postoperative pain.  She denies any drainage from incision or fevers.  Patient is ambulating with 1 crutch today.  She requested a refill of her Percocet pain medication.      Medical History     Past Medical History  Current Outpatient Medications   Medication Sig   . buPROPion (WELLBUTRIN XL) 150 mg extended release 24 hr tablet Take 2 Tablets (300 mg total) by mouth Once a day for 90 days   . busPIRone (BUSPAR) 15 mg Oral Tablet Take 1 Tablet (15 mg total) by mouth Three times a day for 30 days   . cyanocobalamin (VITAMIN B 12) 1,000 mcg Oral Tablet Take 1,000 mcg by mouth Once a day   . meloxicam (MOBIC) 7.5 mg Oral Tablet Take 1 Tablet (7.5 mg total) by mouth Once a day for 30 days   . methadone (DOLOPHINE) 5 mg/5 mL Oral Solution Take 120 mg by mouth Once a day    . oxybutynin (DITROPAN) 5 mg Oral Tablet Take 5 mg by mouth Three times a day as needed    . oxyCODONE-acetaminophen (PERCOCET) 5-325 mg Oral Tablet Take 1 Tablet by mouth Every 4 hours as needed for Pain (Moderate Pain (4-6))   . oxyCODONE-acetaminophen (PERCOCET) 5-325 mg Oral Tablet Take 1 Tablet by mouth Every 4 hours as needed for Pain   . promethazine (PHENERGAN) 25 mg Oral Tablet Take 1 Tablet by mouth Every 4 hours as needed   . pyridoxine (VITAMIN B6) 100 mg Oral Tablet Take 100 mg by mouth Once a day   . valACYclovir (VALTREX) 1 gram Oral Tablet TAKE 1 TABLET BY MOUTH  EVERY DAY *PLAN LIMIT     Allergies   Allergen Reactions   . Shellfish Derived Rash   . Amoxicillin    . Iv Contrast Seizure   . Seafood [Shrimp]      Past Medical History:   Diagnosis Date   . Anxiety    . Convulsions (CMS [facility])     with IV contrast x 1   . Crohn's colitis (CMS [place])    . Depression    . Ectopic pregnancy    . Elevated liver enzymes    . Genital herpes    . Gilbert's disease    . H/O urinary tract infection     1 month ago   . History of intravenous drug use in remission    . 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 Insomnia    . Oral herpes    . Sciatica    . Viral hepatitis    . Viral hepatitis C          Past Surgical History:   Procedure Laterality Date   . COLONOSCOPY  2016    [place]   . DILATION AND CURETTAGE, DIAGNOSTIC / THERAPEUTIC     .  ECTOPIC PREGNANCY SURGERY     . HX CESAREAN SECTION      x 2   . HX TONSILLECTOMY     . HX TUBAL LIGATION     . KNEE SURGERY Left 07/09/2019    knee arthroscopy with partial medial meniscectomy and anterior cruciate ligament reconstruction bone to bone   . LEFT KNEE ARTHROSCOPY WITH ANTERIOR CRUCIATE LIGAMENT RECONSTRUCTION BONE TO BONE Left 07/09/2019    Performed by Alva Garnet, MD at [facility]   . LEFT KNEE ARTHROSCOPY WITH PARTIAL MEDIAL MENISCECTOMY Left 07/09/2019    Performed by Alva Garnet, MD at [place]   . LIVER BIOPSY  05/2019     Family Medical History:     Problem Relation (Age of Onset)    Cancer Sister    Depression Sister    Heart Attack Father, Maternal Grandfather    Hypertension (High Blood Pressure) Mother, Father    Liver Disease Sister            Social History     Socioeconomic History   . Marital status: Legally Separated     Spouse name: Not on file   . Number of children: Not on file   . Years of education: Not on file   . Highest education level: Not on file   Tobacco Use   . Smoking status: Current Every Day Smoker     Packs/day: 0.50     Types: Cigarettes   . Smokeless tobacco: Never Used   .  Tobacco comment: instructed not to smoke after midnight the night before surgery   Vaping Use   . Vaping Use: Every day   Substance and Sexual Activity   . Alcohol use: Not Currently   . Drug use: Yes     Types: Methamphetamines, Marijuana, Opioid, IV     Comment: hx of herion use---clean x 167 days   . Sexual activity: Yes     Partners: Male     Birth control/protection: Female Sterilization   Other Topics Concern   . Ability to Walk 1 Flight of Steps without SOB/CP No     Comment: "a little winded"   . Ability To Do Own ADL's Yes     Social Determinants of Health     Financial Resource Strain:    . Difficulty of Paying Living Expenses:    Food Insecurity:    . Worried About Charity fundraiser in the Last Year:    . Arboriculturist in the Last Year:    Transportation Needs:    . Film/video editor (Medical):    Marland Kitchen Lack of Transportation (Non-Medical):    Physical Activity:    . Days of Exercise per Week:    . Minutes of Exercise per Session:    Stress:    . Feeling of Stress :    Intimate Partner Violence:    . Fear of Current or Ex-Partner:    . Emotionally Abused:    Marland Kitchen Physically Abused:    . Sexually Abused:        Objective   Vitals:  There were no vitals taken for this visit.      There is no height or weight on file to calculate BMI.    Review of Systems:  Review of Systems   Constitutional: Negative for chills and fever.   Respiratory: Negative for shortness of breath.    Cardiovascular: Negative for chest pain.  Skin: Negative for rash.   Neurological: Negative for sensory change.       Physical Exam:  Physical Exam  Musculoskeletal:      Comments: Left knee exam:  Incisions are clean, dry, and well approximated.  There is no erythema, no discharge, no sign of infection.  Normal ecchymosis and mild swelling postoperatively.  Active range of motion of the knee is -20 extension, 65 flexion.  Gait is within normal limits.  Neurovascularly intact distally.   Neurological:      Mental Status: She is alert  and oriented to person, place, and time.   Psychiatric:         Mood and Affect: Mood normal.         Laboratory Studies/Data Reviewed   I have reviewed all available imagining and laboratory studies as appropriate.  Admission on 07/09/2019, Discharged on 07/09/2019   Component Date Value Ref Range Status   . HCG URINE QUALITATIVE 07/09/2019 Not detected  Not detected Final   Appointment on 07/03/2019   Component Date Value Ref Range Status   . Ventricular rate 07/03/2019 81  BPM Final   . Atrial Rate 07/03/2019 81  BPM Final   . PR Interval 07/03/2019 124  ms Final   . QRS Duration 07/03/2019 82  ms Final   . QT Interval 07/03/2019 400  ms Final   . QTC Calculation 07/03/2019 464  ms Final   . Calculated P Axis 07/03/2019 57  degrees Final   . Calculated R Axis 07/03/2019 82  degrees Final   . Calculated T Axis 07/03/2019 21  degrees Final   Appointment on 07/03/2019   Component Date Value Ref Range Status   . SODIUM 07/03/2019 137  136 - 145 mmol/L Final   . POTASSIUM 07/03/2019 3.8  3.5 - 5.1 mmol/L Final   . CHLORIDE 07/03/2019 105  96 - 111 mmol/L Final   . CO2 TOTAL 07/03/2019 24  22 - 30 mmol/L Final   . ANION GAP 07/03/2019 8  4 - 13 mmol/L Final   . CALCIUM 07/03/2019 9.1  8.5 - 10.0 mg/dL Final   . GLUCOSE 07/03/2019 90  65 - 125 mg/dL Final   . BUN 07/03/2019 12  8 - 25 mg/dL Final   . CREATININE 07/03/2019 0.75  0.60 - 1.05 mg/dL Final   . BUN/CREA RATIO 07/03/2019 16  6 - 22 Final   . ESTIMATED GFR 07/03/2019 >90  >=60 mL/min/BSA Final    Estimated Glomerular Filtration Rate (eGFR) calculated using the CKD-EPI (2009) equation, intended for patients 5 years of age and older. If race and/or gender is not documented or "unknown," there will be no eGFR calculation.  Stage, GFR, Classification  G1, 90, Normal or High  G2, 60-89, Mildly decreased  G3a, 45-59, Mildly to moderately decreased  G3b, 30-44, Moderately to severely decreased  G4, 15-29, Severely decreased  G5, <15, Kidney failure  In the absence of  kidney damage, neither G1 or G2 fulfill criteria for CKD per KDIGO.   . WBC 07/03/2019 8.4  3.7 - 11.0 x10^3/uL Final   . RBC 07/03/2019 4.39  3.85 - 5.22 x10^6/uL Final   . HGB 07/03/2019 13.4  11.5 - 16.0 g/dL Final   . HCT 07/03/2019 41.9  34.8 - 46.0 % Final   . MCV 07/03/2019 95.4  78.0 - 100.0 fL Final   . MCH 07/03/2019 30.5  26.0 - 32.0 pg Final   . MCHC 07/03/2019 32.0  31.0 - 35.5  g/dL Final   . RDW-CV 07/03/2019 15.0  11.5 - 15.5 % Final   . PLATELETS 07/03/2019 339  150 - 400 x10^3/uL Final   . MPV 07/03/2019 9.6  8.7 - 12.5 fL Final   . NEUTROPHIL % 07/03/2019 50  % Final   . LYMPHOCYTE % 07/03/2019 34  % Final   . MONOCYTE % 07/03/2019 11  % Final   . EOSINOPHIL % 07/03/2019 4  % Final   . BASOPHIL % 07/03/2019 1  % Final   . NEUTROPHIL # 07/03/2019 4.20  1.50 - 7.70 x10^3/uL Final   . LYMPHOCYTE # 07/03/2019 2.89  1.00 - 4.80 x10^3/uL Final   . MONOCYTE # 07/03/2019 0.96  0.20 - 1.10 x10^3/uL Final   . EOSINOPHIL # 07/03/2019 0.31  <=0.50 x10^3/uL Final   . BASOPHIL # 07/03/2019 <0.10  <=0.20 x10^3/uL Final   . IMMATURE GRANULOCYTE % 07/03/2019 0  0 - 1 % Final    The immature granulocyte fraction (IGF) quantifies total circulating myelocytes, metamyelocytes, and promyelocytes. It is used to evaluate immune responses to infection, inflammation, or other stimuli of the bone marrow. Caution is advised in interpreting test results in neonates who normally have greater numbers of circulating immature blood cells.     . IMMATURE GRANULOCYTE # 07/03/2019 <0.10  <0.10 x10^3/uL Final           Assessment/Plan   Diagnosis:    ICD-10-CM    1. Postoperative visit  Z48.89    2. Rupture of anterior cruciate ligament of left knee, subsequent encounter  S83.512D            Encounter Medications and Orders  No orders of the defined types were placed in this encounter.      Plan:  Operative report and intraoperative photos were reviewed with the patient.  Restrictions status post ACL reconstruction were discussed with  the patient.  Percocet refill request will be sent to Dr. Anthonette Legato.  Continue physical therapy per protocol status post ACL reconstruction.  Follow-up with Dr. Anthonette Legato in 4 weeks for recheck.    Return for Follow-up 4 weeks with Dr. Anthonette Legato for recheck.  [place], [place]  07/22/2019, 14:54        I am seeing this patient independently with co-signing supervising physician present in clinic.        This note was partially generated using MModal Fluency Direct system, and there may be some incorrect words, spellings, and punctuation that were not noted in checking the note before saving

## 2019-07-22 NOTE — Telephone Encounter (Signed)
status post left knee arthroscopy with anterior cruciate ligament reconstruction, bone to bone, and partial medial meniscectomy 07/09/2019.    Last fill 07/17/2019 #30.    Edwin Cap, MA

## 2019-07-23 ENCOUNTER — Ambulatory Visit (INDEPENDENT_AMBULATORY_CARE_PROVIDER_SITE_OTHER): Payer: MEDICAID

## 2019-07-23 ENCOUNTER — Telehealth (INDEPENDENT_AMBULATORY_CARE_PROVIDER_SITE_OTHER): Payer: Self-pay | Admitting: Specialist

## 2019-07-23 DIAGNOSIS — S83512A Sprain of anterior cruciate ligament of left knee, initial encounter: Secondary | ICD-10-CM

## 2019-07-23 MED ORDER — OXYCODONE-ACETAMINOPHEN 5 MG-325 MG TABLET
1.00 | ORAL_TABLET | ORAL | 0 refills | Status: DC | PRN
Start: 2019-07-23 — End: 2019-08-28

## 2019-07-23 NOTE — Telephone Encounter (Signed)
I called [facility] and spoke to scott, he states he could override this script once more but that would be the last time they could do this due to her being on methadone, he states if another script would be sent the doctor would have to call and speak directly to the medical director at [facility]. Amanda Levine, [place]

## 2019-07-23 NOTE — Telephone Encounter (Signed)
Pt called in several times today about prescription status. Pt had called yesterday requesting refill. I spoke with patient and advised we were still waiting physician approval. She stated we would also have to probably make a phone call reguarding the fill because of insurance.  Once the rx was approved I contacted pharmacy to see if there was anything needed for patient to be able to get her refill. They stated because patient is also in a methadone clinic a prior authorization was needed before the patient was able to get her prescription. [facility] was contacted and they were advised pt had just received surgery and that is why she is being rx a pain medication at this time.  Spoke with patient advising she should be able to call the pharmacy and when they run it through again it should go through. I advised if there was any issues to call me back and would go from there. Also, advised pt that next time pt is prescribed pain medication, the physician would have to do a consult (peer to peer) before proceeding with prescription fill    Sigurd Sos, MA

## 2019-07-23 NOTE — Progress Notes (Signed)
Physical Therapy, [facility]  [address]  [place] [address]  [phone]  Physical Therapy Progress Note       Patient Name: Amanda Levine  Date of Birth: December 25, 1985  Height:     Weight:     Room/Bed: Room/bed info not found  Payor: [employer] [place] / Plan: [employer] - [place] / Product Type: Medicaid [place] /       Subjective & Objective:     S: Patient reports 6-7/10 pain in her L knee on this date.  She states that she has been doing her exercises twice a day at home which causes her pain to increase to a "high 8".  She had a f/u w/ PA, Heidi Rusk, yesterday w/ no issues noted to her knee.      O: Patient performed NuStep as warm-up activity to improve knee ROM and functional mobility.  Patient then performed standing, seated, and supine therapeutic exercises per flowsheet to increase L knee muscular strength, stability, ROM, functional mobility, and activity tolerance.  Clinical decision making used to add standing 3-way hip, HS curls (standing and seated), iso LAQ, and SAQ to improve LE strength, stability, and activity tolerance.  Ice pack and IFC estim applied to mediate post therapy soreness.    [place] AMB KNEE EXERCISES 07/23/2019   Visit Number 2   ROM 1-120   Nustep Lv3 10'   Heel / Toe Raises 10x ea   Hip Flexion 10x   Hip Abduction 10x   Hip Extension 10x   Hamstring Curls 10x   LAQ iso against ball at 90 degrees 10x3"   Hamstring Curls YTB 10x   Heel Slides 2x10   SAQ 2x10   SLR 2x10   QS 10x3"   GS 10x3"   Gastroc Stretch 10x10"     [place] AMB Modalities POA 07/23/2019   Electric Stimulation 15 mins   Ice 15 mins           Assessment:    Patient arrived at [facility] w/ use of single axillary crutch, demonstrating an antalgic gait pattern and decreased cadence/R step length.  Patient tolerated treatment well this date.  Verbal/visual cueing provided for correct form of added exercises.  Patient reported some discomfort in her distal quad w/ added exercises.   Patient presents w/ good ROM at this stage in her rehab.        Plan:     Continue to follow patient according to established plan of care.  The risks/benefits of therapy have been discussed with the patient/caregiver and he/she is in agreement with the established plan of care.       Therapist:   Bobbie Stack, PTA  07/23/2019, 15:16    Start Time: 3:15  End Time: 4:15  Total Treatment Time: 60 mins  Therex x3: 45 mins  Estim x1: 15 mins

## 2019-07-24 MED ORDER — OXYCODONE-ACETAMINOPHEN 5 MG-325 MG TABLET
1.00 | ORAL_TABLET | ORAL | 0 refills | Status: DC | PRN
Start: 2019-07-24 — End: 2019-08-28

## 2019-07-24 NOTE — Addendum Note (Signed)
Addended by: Hughie Closs on: 07/24/2019 07:50 AM     Modules accepted: Orders

## 2019-07-25 ENCOUNTER — Encounter (INDEPENDENT_AMBULATORY_CARE_PROVIDER_SITE_OTHER): Payer: Self-pay | Admitting: Rehabilitative and Restorative Service Providers"

## 2019-07-28 ENCOUNTER — Encounter (INDEPENDENT_AMBULATORY_CARE_PROVIDER_SITE_OTHER): Payer: Self-pay | Admitting: Family

## 2019-07-30 ENCOUNTER — Encounter (INDEPENDENT_AMBULATORY_CARE_PROVIDER_SITE_OTHER): Payer: Self-pay | Admitting: Rehabilitative and Restorative Service Providers"

## 2019-08-01 ENCOUNTER — Encounter (INDEPENDENT_AMBULATORY_CARE_PROVIDER_SITE_OTHER): Payer: Self-pay

## 2019-08-04 ENCOUNTER — Telehealth (INDEPENDENT_AMBULATORY_CARE_PROVIDER_SITE_OTHER): Payer: Self-pay | Admitting: Family

## 2019-08-04 LAB — SURGICAL PATHOLOGY SPECIMEN

## 2019-08-04 NOTE — Telephone Encounter (Signed)
-----   Message from Maiah L. Fritzsche sent at 08/04/2019 11:51 AM EDT -----  Regarding: RE: Prescription Question  Contact: [phone]  I already have exercise apps. I watch what I eat an what my carb an calorie intake is. I do physical therapy for my knee twice a week other then that I can't do a whole lota exercise. I just had knee surgery.Peggye Form been doing research an stuff ant there's keto pills an adapex .Marland Kitchenu can't give not of them to help with weight lose?

## 2019-08-04 NOTE — Telephone Encounter (Signed)
please advise.

## 2019-08-05 ENCOUNTER — Other Ambulatory Visit: Payer: Self-pay

## 2019-08-05 ENCOUNTER — Ambulatory Visit (INDEPENDENT_AMBULATORY_CARE_PROVIDER_SITE_OTHER): Payer: MEDICAID

## 2019-08-05 DIAGNOSIS — S83512A Sprain of anterior cruciate ligament of left knee, initial encounter: Secondary | ICD-10-CM

## 2019-08-05 NOTE — Progress Notes (Signed)
Physical Therapy, [facility]  [address]  [place] [address]  [phone]  Physical Therapy Progress Note       Patient Name: Amanda Levine  Date of Birth: [birth year]  Height:     Weight:     Room/Bed: Room/bed info not found  Payor: [employer] - [place] / Plan: [employer] BET[employer] - [place] / Product Type: Medicaid [place] /       Subjective & Objective:     S: Patient reports 5/10 pain and soreness in the posterior aspect of her L knee and HS on this date.  She states that she is still experiencing numbness and decreased sensation from her knee down to her toes which has limited her ability to DF during ambulation.  She says that she has been ambulating w/o an AD for a week now.      O: Patient performed standing, seated, and supine therapeutic exercises per flowsheet to facilitate WB through L LE and increase L knee muscular strength, stability, ROM, functional mobility, and activity tolerance.  Increased exercises reps this date to improve her activity tolerance and muscular endurance.  Clinical decision making used to add partial lunges this date to improve her WB tolerance, stability, and functional knee mobility.  Patient declined modalities at end of treatment today, but was encouraged to apply ice as she may have increased soreness.    [place] [facility] KNEE EXERCISES 08/05/2019   Visit Number 3   ROM 0-129   Nustep Lv3 10'   Heel / Toe Raises 20x ea   Hip Flexion 20x   Hip Abduction 20x   Hip Extension 20x   Hamstring Curls 20x   Marching 20x L   Partial Lunges 1'   LAQ iso against ball at 90 degrees 20x3"   Hamstring Curls YTB 20x   Heel Slides 20x   SAQ 2x10   SLR 2x10   QS 20x3"   GS -   Gastroc Stretch 10x10"       Assessment:    Patient arrived at [facility] demonstrating an antalgic gait pattern w/ a limp on her L LE.  Patient tolerated increased reps well this date.  Verbal/visual cueing provided for correct form of partial lunges.  Patient displayed quad fasiculations during SAQ  and SLR secondary to quad weakness.  Patient was able to achieve 0 degrees of ext and improve her knee flex ROM by 9 degrees since her last treatment.       Plan:     Continue to follow patient according to established plan of care.  The risks/benefits of therapy have been discussed with the patient/caregiver and he/she is in agreement with the established plan of care.       Therapist:   Bobbie Stack, PTA  08/05/2019, 15:24    Start Time: 3:15  End Time: 3:54  Total Treatment Time: 39 mins  Therex x3: 39 mins

## 2019-08-05 NOTE — Telephone Encounter (Signed)
Pt notified via MyChart message. Mike Gip, RN  08/05/2019, 08:48

## 2019-08-05 NOTE — Telephone Encounter (Signed)
I don't know what Keto pills are and she is not a good candidate for Adipex. You also have to be exercising and using a diet plan to use it successfully.

## 2019-08-07 ENCOUNTER — Encounter (INDEPENDENT_AMBULATORY_CARE_PROVIDER_SITE_OTHER): Payer: MEDICAID | Admitting: Rehabilitative and Restorative Service Providers"

## 2019-08-11 ENCOUNTER — Encounter (INDEPENDENT_AMBULATORY_CARE_PROVIDER_SITE_OTHER): Payer: Self-pay

## 2019-08-11 ENCOUNTER — Telehealth (INDEPENDENT_AMBULATORY_CARE_PROVIDER_SITE_OTHER): Payer: Self-pay | Admitting: Family

## 2019-08-11 ENCOUNTER — Other Ambulatory Visit (INDEPENDENT_AMBULATORY_CARE_PROVIDER_SITE_OTHER): Payer: Self-pay | Admitting: Specialist

## 2019-08-11 DIAGNOSIS — S83512D Sprain of anterior cruciate ligament of left knee, subsequent encounter: Secondary | ICD-10-CM

## 2019-08-11 MED ORDER — HYDROCODONE 5 MG-ACETAMINOPHEN 325 MG TABLET
1.00 | ORAL_TABLET | ORAL | 0 refills | Status: DC | PRN
Start: 2019-08-11 — End: 2019-08-28

## 2019-08-11 NOTE — Telephone Encounter (Signed)
I contacted patient to let her know I contacted her insurance company and they have stopped paying for the medication they only may for it for a month. Pt verbalized understanding

## 2019-08-11 NOTE — Telephone Encounter (Signed)
-----   Message from Raeann L. Poppell sent at 08/10/2019  1:02 PM EDT -----  Regarding: RE: Prescription Question  Contact: [phone]  I need a refill on my buspirone hcl 15mg s..but I was gonna see if there was by chance if I could get an increase on these to a little bit higher of a dose I can feel them wanting  to work but very very lightly. When I first started taken them they helped tremendously but now it's as if they are not. An I'm also having a hard time sleeping at night. I've tried sleep apps, meditation sounds ,melatonin , Tylenol p.ms ,  Allerlife sleep an the zzz quills an all it's doing is making me restless , up an down 3 to 4 times a night. I havent had a good solid nights sleep in months.. I keep having bad reoccurring dreams where I'm drowning an actually dieing. Night terrors I guess. It's making it hard for me to focus the following day on my work an studies. I've talked to my counselor ,my recovery coach  an sponser about the dreams an stuff an they have all told me to talk to my doctor about maybe getting something to help with sleep or night terrors.  I was diagnosed with insomnia when I went   to the [facility] yrs ago an I dont think its went away. I was on seroquel an minipress  then. Please help.

## 2019-08-11 NOTE — Telephone Encounter (Signed)
Pt notified via MyChart message. Mike Gip, RN  08/11/2019, 10:14

## 2019-08-11 NOTE — Telephone Encounter (Signed)
Buspiurone dose not come in a higher dose.  Recommendation is 15 mg 3x a day.  If she wants to make an appt we can start something for night terrors.

## 2019-08-11 NOTE — Telephone Encounter (Signed)
status post left knee arthroscopy with anterior cruciate ligament reconstruction, bone to bone, and partial medial meniscectomy 07/09/2019.    Last fill Percocet 07/24/2019    Submitting for [employer]

## 2019-08-11 NOTE — Telephone Encounter (Signed)
Please advise 

## 2019-08-12 ENCOUNTER — Other Ambulatory Visit (INDEPENDENT_AMBULATORY_CARE_PROVIDER_SITE_OTHER): Payer: Self-pay | Admitting: Family

## 2019-08-12 ENCOUNTER — Other Ambulatory Visit (INDEPENDENT_AMBULATORY_CARE_PROVIDER_SITE_OTHER): Payer: Self-pay | Admitting: Specialist

## 2019-08-12 DIAGNOSIS — F419 Anxiety disorder, unspecified: Secondary | ICD-10-CM

## 2019-08-12 DIAGNOSIS — S83512D Sprain of anterior cruciate ligament of left knee, subsequent encounter: Secondary | ICD-10-CM

## 2019-08-12 MED ORDER — BUSPIRONE 15 MG TABLET
15.00 mg | ORAL_TABLET | Freq: Three times a day (TID) | ORAL | 5 refills | Status: DC
Start: 2019-08-12 — End: 2020-03-09

## 2019-08-12 MED ORDER — IBUPROFEN 800 MG TABLET
800.00 mg | ORAL_TABLET | Freq: Three times a day (TID) | ORAL | 0 refills | Status: DC | PRN
Start: 2019-08-12 — End: 2019-08-28

## 2019-08-12 NOTE — Telephone Encounter (Signed)
-----   Message from Roena L. Vesely sent at 08/12/2019  8:48 AM EDT -----  Regarding: RE: Prescription Question  Contact: [phone]  Okay I made an appt for friday at 1130 to come in an speak with Shanda Bumps about the while sleeping g issues...an I have no more refills on the Buspruone HCL 15mg s do I need to wait to friday when I speak with to get a refill prescription or can someone [facility] to [facility] [place] [address] [place] [address] [place]

## 2019-08-12 NOTE — Telephone Encounter (Signed)
Talked with Dr.Herriott just start ibuprofen 800mg 

## 2019-08-12 NOTE — Telephone Encounter (Signed)
RX Pending

## 2019-08-13 ENCOUNTER — Ambulatory Visit (INDEPENDENT_AMBULATORY_CARE_PROVIDER_SITE_OTHER): Payer: MEDICAID

## 2019-08-13 ENCOUNTER — Other Ambulatory Visit: Payer: Self-pay

## 2019-08-13 DIAGNOSIS — S83512A Sprain of anterior cruciate ligament of left knee, initial encounter: Secondary | ICD-10-CM

## 2019-08-13 NOTE — Progress Notes (Signed)
Physical Therapy, [facility]  [address]  [place] [address]  [phone]  Physical Therapy Progress Note       Patient Name: Amanda Levine  Date of Birth: 1985-04-07  Height:     Weight:     Room/Bed: Room/bed info not found  Payor: [employer] BETTER HE[employer] - [place] / Plan: [employer] BETTER HE[employer] - [place] / Product Type: Medicaid [place] /       Subjective & Objective:   Patient reports that after she got out of the shower this morning she lost her balance while putting on her clothes causing her to fall into a wall. Patient reports that she hit her L Knee on the wall causing bleeding from the lateral surgical incision.   Therapeutic exercises as per flow sheet to improve L Knee/LE AROM, motor strength, sustained activity/standing/walking tolerance, and flexibility.   Ice/IFCx15' to decrease L Knee pain, Joint swelling, and inflammation post-activities.    08/13/19 1600   Standing   Visit Number 4   ROM 0-139   Nustep Lv3 10'   Heel / Toe Raises 20x ea   Hip Flexion 20x   Hip Abduction 20x   Hip Extension 20x   Hamstring Curls 20x   Marching 20x L   Partial Lunges 1'   Seated   LAQ iso against ball at 90 degrees 20x3"   Hamstring Curls YTB 20x   Supine   Heel Slides 20x   SAQ 2x10   SLR 2x10   QS 20x3"   GS 10x3"   Gastroc Stretch 10x10"      08/13/19 1600   Modalities   Electric Stimulation 15 mins   Ice 15 mins     Assessment:   L Knee AAROM: 0-139.  Patient reports discomfort in her L Hamstrings with knee flexion exercises. Patient had improved L Knee ROM noted when compared to her last PT visit. Patient reports that her L Knee still feels weak when ambulating, with exericses, and when ambulating in the clinic. Patient had no c/o increased L Knee pain noted at the end of her treatment today.     Plan:     Continue to follow patient according to established plan of care.  The risks/benefits of therapy have been discussed with the patient/caregiver and he/she is in agreement with the  established plan of care.       Therapist:   Renae Gloss, PTA  08/13/2019, 15:20    Start Time: 1515  End Time: 1615  Total Treatment Time: 60 minutes. 23' of TE, and 15' of ice/estim.

## 2019-08-15 ENCOUNTER — Encounter (INDEPENDENT_AMBULATORY_CARE_PROVIDER_SITE_OTHER): Payer: MEDICAID | Admitting: Family

## 2019-08-15 ENCOUNTER — Encounter (INDEPENDENT_AMBULATORY_CARE_PROVIDER_SITE_OTHER): Payer: Self-pay

## 2019-08-25 ENCOUNTER — Encounter (INDEPENDENT_AMBULATORY_CARE_PROVIDER_SITE_OTHER): Payer: Self-pay

## 2019-08-26 ENCOUNTER — Encounter (INDEPENDENT_AMBULATORY_CARE_PROVIDER_SITE_OTHER): Payer: Self-pay | Admitting: Specialist

## 2019-08-27 ENCOUNTER — Encounter (INDEPENDENT_AMBULATORY_CARE_PROVIDER_SITE_OTHER): Payer: Self-pay | Admitting: Rehabilitative and Restorative Service Providers"

## 2019-08-27 NOTE — H&P (Signed)
[facility] [place]  [place] [address]  [phone]    Progress Note  Name: Amanda Levine  MRN: [id number]  DOB: 05-Jan-1985  Age: 34 y.o.  Date: 08/28/2019    Reason for Visit: Follow up GI Appointment    History of Present Illness:  Amanda Levine is a 34 y.o. female who presents today for follow-up of elevated LFTs, possible Crohn's.    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  05/2019 ANA- positive, CBC/AMA/ASMA/H.pylori/TTG-negative, ALT 316, AST 157   04/2019 Fibrotest: F0/A3, HCV PCR 866000, HAV IgG-negative, HBsAb-reactive, HBsAg-negative, AST 152, ALT 207  Liver ultrasound: tiny adherent stones or gallbladder polyps without signs of cholecystitis, moderate dilation of CBD to 10m, recommend MRCP vs ERCP vs CT, normal liver appearance    CBD dilation/elevated LFTS/change in bowels/Crohns- stool studies, labs, MRCP, t/c repeat colonoscopy- didn't get old colonoscopy, [place] for Hep C, get hep c trearted and then possibly treat for autoimmune hepatitis    Continues to follow with Dr. BCarmie End Has to get labs done 6 months from previous to show chronic hepatitis C infection. Has follow-up in 10/2019 for this and hopefully will be approved for treatment.     Bowel movements typically once every 3-4 days. When she goes, very slimy and snotty. A lot of blood too when she goes. Using hemorrhoid cream as needed but not really helping much. Taking Miralax every day and not really helping though. Has to strain, hard stools and little balls, very large and causing rectal pains. Some mild abdominal pains, more in the lower part of her stomach, worse when she hasn't gone for a few days. No ease with BM's though.     Drinking plenty of water.     Has since received knee surgery, had ACL and meniscus reconstruction by Dr. HAnthonette Legato Has repeat MRi coming up for this.     Currently on methadone, clean since 01/2019, going routinely to meetings.     Denies  fevers, chills, nausea, vomiting, heartburn, reflux, melena, hematochezia, or unintentional weight loss.      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    Insomnia     Oral herpes     Sciatica     Viral hepatitis     Viral hepatitis C      Past Surgical History:   Procedure Laterality Date    COLONOSCOPY  2016    [place]    DILATION AND CURETTAGE, DIAGNOSTIC / THERAPEUTIC      ECTOPIC PREGNANCY SURGERY      HX CESAREAN SECTION      x 2    HX TONSILLECTOMY      HX TUBAL LIGATION      KNEE SURGERY Left 07/09/2019    knee arthroscopy with partial medial meniscectomy and anterior cruciate ligament reconstruction bone to bone    LIVER BIOPSY  05/2019     Current Outpatient Medications   Medication Sig    buPROPion (WELLBUTRIN XL) 150 mg extended release 24 hr tablet Take 2 Tablets (300 mg total) by mouth Once a day for 90 days    busPIRone (BUSPAR) 15 mg Oral Tablet Take 1 Tablet (15 mg total) by mouth Three times a day for 30 days    cyanocobalamin (VITAMIN B 12) 1,000 mcg Oral Tablet Take 1,000 mcg by mouth Once a day    meloxicam (MOBIC) 7.5 mg Oral Tablet TAKE 1 TABLET (7.5 MG TOTAL) BY MOUTH ONCE A DAY FOR 30 DAYS    methadone (DOLOPHINE) 5 mg/5 mL Oral Solution Take 120 mg by mouth Once a day     oxybutynin (DITROPAN) 5 mg Oral Tablet Take 5 mg by mouth Three times a day as needed     promethazine (PHENERGAN) 25 mg Oral Tablet Take 1 Tablet by mouth Every 4 hours as needed    valACYclovir (VALTREX) 1 gram Oral Tablet TAKE 1 TABLET BY MOUTH EVERY DAY *PLAN LIMIT     Allergies   Allergen Reactions    Shellfish Derived Rash     Amoxicillin     Iv Contrast Seizure    Seafood [Shrimp]      Family Medical History:     Problem Relation (Age of Onset)    Cancer Sister    Depression Sister    Heart Attack Father, Maternal Grandfather    Hypertension (High Blood Pressure) Mother, Father    Liver Disease Sister        Social History     Socioeconomic History    Marital status: Legally Separated     Spouse name: Not on file    Number of children: Not on file    Years of education: Not on file    Highest education level: Not on file   Occupational History    Not on file   Tobacco Use    Smoking status: Current Every Day Smoker     Packs/day: 0.50     Types: Cigarettes    Smokeless tobacco: Never Used    Tobacco comment: instructed not to smoke after midnight the night before surgery   Vaping Use    Vaping Use: Some days   Substance and Sexual Activity    Alcohol use: Not Currently    Drug use: Not Currently     Types: Methamphetamines, Marijuana, Opioid, IV     Comment: hx of herion use---clean x 167 days    Sexual activity: Yes     Partners: Male     Birth control/protection: Female Sterilization   Other Topics Concern    Ability to Walk 1 Flight of Steps without SOB/CP No     Comment: "a little winded"    Routine Exercise Not Asked    Ability to Walk 2 Flight of Steps without SOB/CP Not Asked    Unable to Ambulate Not Asked    Total Care Not Asked    Ability To Do Own ADL's Yes    Uses Walker Not Asked    Other Activity Level Not Asked    Uses Cane Not Asked   Social History Narrative    Not on file     Social Determinants of Health     Financial Resource Strain:     Difficulty of Paying Living Expenses:    Food Insecurity:     Worried About Estate manager/land agent of Food in the Last Year:  Ran Out of Food in the Last Year:    Transportation Needs:     Film/video editor (Medical):     Lack of Transportation (Non-Medical):    Physical Activity:     Days of Exercise per Week:     Minutes of Exercise per Session:     Stress:     Feeling of Stress :    Intimate Partner Violence:     Fear of Current or Ex-Partner:     Emotionally Abused:     Physically Abused:     Sexually Abused:      Review of Systems:     General Negative for: Appetite Loss, Weight loss, Fatigue out of ordinary     ENT Negative for: Nose bleeds, Mouth sores, Sore throat     Respiratory Negative for: Coughing up blood, Wheezing, Nocturnal cough     Cardiovascular Negative for: Chest Pain, Leg edema, Indigestion  Gastronintestinal Positive for: Change in bowl habits  Gastronintestinal Negative for: Heartburn, Nausea, Vomiting, Difficulty swallowing, Black/Tarry stools, Abdominal pain  Genitourinary Positive for: Vaginal discharge  Genitourinary Negative for: Pelvic Pain, Urinary frequency  All other review of systems negative    Physical Exam:  BP 106/72    Ht 1.626 m (5' 4" )    Wt 101 kg (223 lb 3.2 oz)    BMI 38.31 kg/m     Physical Exam  Constitutional:       General: She is not in acute distress.     Appearance: She is not diaphoretic.   HENT:      Head: Normocephalic.   Eyes:      Pupils: Pupils are equal, round, and reactive to light.   Cardiovascular:      Rate and Rhythm: Normal rate and regular rhythm.      Heart sounds: Normal heart sounds. No murmur heard.     Pulmonary:      Effort: Pulmonary effort is normal. No respiratory distress.      Breath sounds: Normal breath sounds. No wheezing.   Abdominal:      General: Bowel sounds are normal. There is no distension.      Palpations: Abdomen is soft. There is no mass.      Tenderness: There is no abdominal tenderness. There is no guarding or rebound.   Musculoskeletal:         General: Normal range of motion.   Skin:     General: Skin is warm and dry.      Findings: No erythema or rash.   Neurological:      Mental Status: She is alert and oriented to person, place, and time.   Psychiatric:         Judgment: Judgment normal.       Assessment and Plan:    ICD-10-CM    1. Elevated LFTs  R79.89    2.  Crohn's disease (CMS [place])  K50.90    3. Hepatitis C  B19.20      Discussed and reviewed testing. Liver biopsy stating possible autoimmune hepatitis but would like to defer treatment until after she is cleared of Hepatitis C, hopefully LFTs will improve and if so, will hold off on treatment for autoimmune hepatitis. If still elevated, then would consider treatment for this. Will again request previous colonoscopy to review. Advised to complete stool studies as previously ordered. To consider trial of prednisone or mesalamine enemas if fecal calprotectin is elevated or repeat colonoscopy, but would need to be at an  appropriate time from her knee surgery as she is unfortunately having some complications from this. Continue Miralax daily, can titrate up to three times daily if needed. Follow-up in 6 months, sooner if needed.     Fara Chute, FNP-C  08/27/2019, 12:45    This note may have been partially generated using MModal Fluency Direct system, and there may be some incorrect words, spellings, and punctuation that were not noted in checking the note before saving, though effort was made to avoid such errors.

## 2019-08-28 ENCOUNTER — Telehealth (INDEPENDENT_AMBULATORY_CARE_PROVIDER_SITE_OTHER): Payer: Self-pay | Admitting: Specialist

## 2019-08-28 ENCOUNTER — Encounter (INDEPENDENT_AMBULATORY_CARE_PROVIDER_SITE_OTHER): Payer: Self-pay | Admitting: Nurse Practitioner

## 2019-08-28 ENCOUNTER — Ambulatory Visit: Payer: MEDICAID | Attending: Nurse Practitioner | Admitting: Nurse Practitioner

## 2019-08-28 ENCOUNTER — Other Ambulatory Visit: Payer: Self-pay

## 2019-08-28 VITALS — BP 106/72 | Ht 64.0 in | Wt 223.2 lb

## 2019-08-28 DIAGNOSIS — K5090 Crohn's disease, unspecified, without complications: Secondary | ICD-10-CM

## 2019-08-28 DIAGNOSIS — R7989 Other specified abnormal findings of blood chemistry: Secondary | ICD-10-CM

## 2019-08-28 DIAGNOSIS — B1920 Unspecified viral hepatitis C without hepatic coma: Secondary | ICD-10-CM

## 2019-08-28 NOTE — Telephone Encounter (Signed)
Pt called in about rx, she hadnt heard if anything was going to be rx for pain , advised a rx was approved for ibuprofen 800 and sent to pharmacy on 08/12/2019, advised pt to contact pharmacy and if they didn't rec to let us know     Sigurd Sos, MA

## 2019-08-29 ENCOUNTER — Encounter (INDEPENDENT_AMBULATORY_CARE_PROVIDER_SITE_OTHER): Payer: Self-pay

## 2019-09-05 ENCOUNTER — Telehealth (INDEPENDENT_AMBULATORY_CARE_PROVIDER_SITE_OTHER): Payer: Self-pay | Admitting: Family

## 2019-09-05 ENCOUNTER — Encounter (INDEPENDENT_AMBULATORY_CARE_PROVIDER_SITE_OTHER): Payer: Self-pay | Admitting: Family

## 2019-09-05 NOTE — Telephone Encounter (Signed)
Please advise 

## 2019-09-05 NOTE — Telephone Encounter (Signed)
-----   Message from Adessa L. Kastelic sent at 09/05/2019  1:10 PM EDT -----  Regarding: Swelling  an bloating  I've noticed lately I'm retaining a lot of water. Is there by chance I can get some kind of water pill.. methadone makes me retain alot of water . An I'm up to 130.mgs of methadone daily.  An what would be best to do or take to make ur metabolism speed up. I work mon-Friday 8 to 4 so I'm active an I walk in the evenings after I get back from my [employer] an it's not making my metabolism  any different.  An I've cut out sweets sugars an soda.

## 2019-09-09 NOTE — Telephone Encounter (Signed)
Called and left message for a return call. [name], MA  09/09/2019, 14:45

## 2019-09-09 NOTE — Telephone Encounter (Signed)
Schedule an appt -- ok for video visit if she cant come in

## 2019-09-12 NOTE — Telephone Encounter (Signed)
Pt is scheduled for 09/15/19. [name], MA  09/12/2019, 16:53

## 2019-09-15 ENCOUNTER — Encounter (INDEPENDENT_AMBULATORY_CARE_PROVIDER_SITE_OTHER): Payer: Self-pay | Admitting: Family

## 2019-09-18 ENCOUNTER — Encounter (INDEPENDENT_AMBULATORY_CARE_PROVIDER_SITE_OTHER): Payer: MEDICAID | Admitting: Specialist

## 2019-09-22 ENCOUNTER — Encounter (INDEPENDENT_AMBULATORY_CARE_PROVIDER_SITE_OTHER): Payer: Self-pay | Admitting: Family

## 2019-09-22 ENCOUNTER — Other Ambulatory Visit (INDEPENDENT_AMBULATORY_CARE_PROVIDER_SITE_OTHER): Payer: Self-pay | Admitting: Family

## 2019-09-22 DIAGNOSIS — F419 Anxiety disorder, unspecified: Secondary | ICD-10-CM

## 2019-09-22 MED ORDER — BUPROPION HCL XL 150 MG 24 HR TABLET, EXTENDED RELEASE
300.00 mg | ORAL_TABLET | Freq: Every day | ORAL | 0 refills | Status: DC
Start: 2019-09-22 — End: 2019-12-22

## 2019-09-22 NOTE — Telephone Encounter (Signed)
RX pending if ok.     Last scheduled appointment with you was 04/17/2019.  Currently scheduled future appointment is Visit date not found.    last rx: 06/03/19 # 180 NRF    Mike Gip, RN  09/22/2019, 09:03

## 2019-09-22 NOTE — Telephone Encounter (Signed)
-----   Message from Amanda Levine sent at 09/22/2019  8:42 AM EDT -----  Regarding: Refill   I have no more refills on my Bupropion HCL XL 150mg  an I only have one more days dose left

## 2019-09-25 ENCOUNTER — Ambulatory Visit (INDEPENDENT_AMBULATORY_CARE_PROVIDER_SITE_OTHER): Payer: MEDICAID | Admitting: Specialist

## 2019-09-25 ENCOUNTER — Encounter (INDEPENDENT_AMBULATORY_CARE_PROVIDER_SITE_OTHER): Payer: Self-pay | Admitting: Specialist

## 2019-09-25 ENCOUNTER — Other Ambulatory Visit: Payer: Self-pay

## 2019-09-25 DIAGNOSIS — S83512D Sprain of anterior cruciate ligament of left knee, subsequent encounter: Secondary | ICD-10-CM

## 2019-09-25 DIAGNOSIS — Z4889 Encounter for other specified surgical aftercare: Secondary | ICD-10-CM

## 2019-09-25 NOTE — Progress Notes (Signed)
[facility]  [place]  [place] [address]    Progress Note    Name: Amanda Levine MRN:  [id number]   Date: 09/25/2019 Age: 34 y.o.     Date of Birth: 01-21-1985        Chief Complaint: Post Op ( LEFT KNEE ARTHROSCOPY WITH ANTERIOR CRUCIATE LIGAMENT RECONSTRUCTION BONE TO BONE -07/09/2019)      HPI: Amanda Levine is a 34 y.o. female presenting for a postoperative evaluation. She is s/p a left knee arthroscopy with ACL reconstruction bone to bone performed on 07/09/2019. Overall the pt is doing well. Pain is well controlled and significantly improved compared to prior to the procedure. She will still have some discomfort at night and in the mornings. Also notes numbness along the widespread knee. Pt has not been attending therapy for a month due to her work schedule. She is pleased with the procedure and progress made this far. No additional complaints today.      Review of Systems:  Review of Systems   Constitutional: Negative for chills, fever and weight loss.   HENT: Negative for ear pain and sore throat.    Eyes: Negative for blurred vision and double vision.   Respiratory: Negative for cough and wheezing.    Cardiovascular: Negative for chest pain and palpitations.   Gastrointestinal: Negative for abdominal pain, nausea and vomiting.   Musculoskeletal:        Left knee pain   Neurological: Negative for weakness and headaches.        Left knee numbness   Psychiatric/Behavioral: Negative for depression. The patient is not nervous/anxious.    All other systems reviewed and are negative.      Past Medical History:  Past Medical History:   Diagnosis Date    Anxiety     Convulsions (CMS HCC)     with IV contrast x 1    Crohn's colitis (CMS HCC)     Depression     Ectopic pregnancy     Elevated liver enzymes     Genital herpes     Gilbert's disease     H/O urinary tract infection     1 month ago    History of intravenous drug use in remission     HPV in  female     Patient states she has this    Insomnia     Oral herpes     Sciatica     Viral hepatitis     Viral hepatitis C          Past Surgical History:   Past Surgical History:   Procedure Laterality Date    Colonoscopy  2016    Dilation and curettage, diagnostic / therapeutic      Ectopic pregnancy surgery      Hx cesarean section      Hx tonsillectomy      Hx tubal ligation      Knee surgery Left 07/09/2019    Liver biopsy  05/2019     Allergies:  Allergies   Allergen Reactions    Shellfish Derived Rash    Amoxicillin     Iv Contrast Seizure    Seafood [Shrimp]      Medications:  Current Outpatient Medications   Medication Sig    buPROPion (WELLBUTRIN XL) 150 mg extended release 24 hr tablet Take 2 Tablets (300 mg total) by mouth Once a day for 90 days    busPIRone (BUSPAR) 15 mg Oral  Tablet Take 1 Tablet (15 mg total) by mouth Three times a day for 30 days    cyanocobalamin (VITAMIN B 12) 1,000 mcg Oral Tablet Take 1,000 mcg by mouth Once a day    meloxicam (MOBIC) 7.5 mg Oral Tablet TAKE 1 TABLET (7.5 MG TOTAL) BY MOUTH ONCE A DAY FOR 30 DAYS    methadone (DOLOPHINE) 5 mg/5 mL Oral Solution Take 120 mg by mouth Once a day     oxybutynin (DITROPAN) 5 mg Oral Tablet Take 5 mg by mouth Three times a day as needed     promethazine (PHENERGAN) 25 mg Oral Tablet Take 1 Tablet by mouth Every 4 hours as needed    valACYclovir (VALTREX) 1 gram Oral Tablet TAKE 1 TABLET BY MOUTH EVERY DAY *PLAN LIMIT     Family History:  Family Medical History:     Problem Relation (Age of Onset)    Cancer Sister    Depression Sister    Heart Attack Father, Maternal Grandfather    Hypertension (High Blood Pressure) Mother, Father    Liver Disease Sister            Social History:  Social History     Socioeconomic History    Marital status: Legally Separated     Spouse name: Not on file    Number of children: Not on file    Years of education: Not on file    Highest education level: Not on file   Tobacco Use     Smoking status: Current Every Day Smoker     Packs/day: 0.50     Types: Cigarettes    Smokeless tobacco: Never Used    Tobacco comment: instructed not to smoke after midnight the night before surgery   Vaping Use    Vaping Use: Some days   Substance and Sexual Activity    Alcohol use: Not Currently    Drug use: Not Currently     Types: Methamphetamines, Marijuana, Opioid, IV     Comment: hx of herion use---clean x 167 days    Sexual activity: Yes     Partners: Male     Birth control/protection: Female Sterilization   Other Topics Concern    Ability to Walk 1 Flight of Steps without SOB/CP No     Comment: "a little winded"    Ability To Do Own ADL's Yes     Social Determinants of Health     Financial Resource Strain:     Difficulty of Paying Living Expenses:    Food Insecurity:     Worried About Charity fundraiser in the Last Year:     Arboriculturist in the Last Year:    Transportation Needs:     Film/video editor (Medical):     Lack of Transportation (Non-Medical):    Physical Activity:     Days of Exercise per Week:     Minutes of Exercise per Session:    Stress:     Feeling of Stress :    Intimate Partner Violence:     Fear of Current or Ex-Partner:     Emotionally Abused:     Physically Abused:     Sexually Abused:        Objective:  There were no vitals taken for this visit.      There is no height or weight on file to calculate BMI.    Physical Exam:  Physical Exam  HENT:      Right  Ear: External ear normal.      Left Ear: External ear normal.   Eyes:      Conjunctiva/sclera: Conjunctivae normal.   Cardiovascular:      Rate and Rhythm: Normal rate and regular rhythm.      Heart sounds: No murmur heard.   No friction rub. No gallop.    Pulmonary:      Effort: Pulmonary effort is normal.      Breath sounds: Normal breath sounds.   Abdominal:      General: Bowel sounds are normal. There is no distension.   Musculoskeletal:      Cervical back: Normal range of motion.   Skin:      General: Skin is warm and dry.   Neurological:      Mental Status: She is alert and oriented to person, place, and time.   Psychiatric:         Mood and Affect: Mood and affect normal.     Left knee: 5/5 flexion strength. 4/5 extension strength. Well healing incisions. Decreased sensation in a stocking distribution below the knee, not following any particular dermatome or peripheral nerve distribution. No motor deficits noted distally in the extremity. Negative Lachman's test. Negative Posterior Drawer's sign.    Laboratory Studies/Data Reviewed:  I have reviewed all available imagining and laboratory studies as appropriate.  No visits with results within 1 Month(s) from this visit.   Latest known visit with results is:   Admission on 07/09/2019, Discharged on 07/09/2019   Component Date Value Ref Range Status    HCG URINE QUALITATIVE 07/09/2019 Not detected  Not detected Final       Encounter Medications and Orders  Orders Placed This Encounter    DME - KNEE BRACE       Imaging:  None today.    Plan:   From a postoperative standpoint the pt is doing well. Pain, mobility, and strength have improved compared to prior to the procedure. They were also encouraged that they can continue resuming normal activity as tolerated. Pt was reassured that her symptoms should continue to improve as the quadriceps muscle can take up to a month to strengthen. If her symptoms do not resolve or reduce by her next evaluation we will consider an EMG. Pt was advised to continue performing physical therapy exercises. Will size and provide pt with an ACL brace for periods of activity.    Follow up in 2 months.    Diagnosis:    ICD-10-CM    1. Rupture of anterior cruciate ligament of left knee, subsequent encounter  S83.512D DME - KNEE BRACE          I am scribing for, and in the presence of, Dr. Hughie Closs III for services provided on 09/25/2019.  Bobby Rumpf, SCRIBE   Bobby Rumpf, SCRIBE  09/25/2019, 11:06    Don't forget .tgscribe  AND .sign      This note was partially generated using MModal Fluency Direct system, and there may be some incorrect words, spellings, and punctuation that were not noted in checking the note before saving.

## 2019-09-26 ENCOUNTER — Other Ambulatory Visit (INDEPENDENT_AMBULATORY_CARE_PROVIDER_SITE_OTHER): Payer: Self-pay | Admitting: Rehabilitative and Restorative Service Providers"

## 2019-09-26 DIAGNOSIS — M2392 Unspecified internal derangement of left knee: Secondary | ICD-10-CM

## 2019-09-26 DIAGNOSIS — M1712 Unilateral primary osteoarthritis, left knee: Secondary | ICD-10-CM

## 2019-09-26 NOTE — Telephone Encounter (Signed)
Requesting refill of Meloxicam. Last fill in August. Thanks!

## 2019-10-01 ENCOUNTER — Ambulatory Visit: Payer: MEDICAID | Attending: INFECTIOUS DISEASE

## 2019-10-01 ENCOUNTER — Other Ambulatory Visit: Payer: Self-pay

## 2019-10-01 DIAGNOSIS — B182 Chronic viral hepatitis C: Secondary | ICD-10-CM | POA: Insufficient documentation

## 2019-10-01 LAB — COMPREHENSIVE METABOLIC PANEL, NON-FASTING
ALBUMIN: 3.6 g/dL (ref 3.5–5.0)
ALKALINE PHOSPHATASE: 58 U/L (ref 40–110)
ALT (SGPT): 217 U/L — ABNORMAL HIGH (ref 8–22)
ANION GAP: 8 mmol/L (ref 4–13)
AST (SGOT): 137 U/L — ABNORMAL HIGH (ref 8–45)
BILIRUBIN TOTAL: 0.4 mg/dL (ref 0.3–1.3)
BUN/CREA RATIO: 21 (ref 6–22)
BUN: 17 mg/dL (ref 8–25)
CALCIUM: 9.7 mg/dL (ref 8.5–10.0)
CHLORIDE: 105 mmol/L (ref 96–111)
CO2 TOTAL: 25 mmol/L (ref 22–30)
CREATININE: 0.8 mg/dL (ref 0.60–1.05)
ESTIMATED GFR: >90 mL/min/BSA (ref >=60)
GLUCOSE: 95 mg/dL (ref 65–125)
POTASSIUM: 4.3 mmol/L (ref 3.5–5.1)
PROTEIN TOTAL: 7.5 g/dL (ref 6.4–8.3)
SODIUM: 138 mmol/L (ref 136–145)

## 2019-10-03 LAB — HEPATITIS C VIRUS (HCV) RNA DETECTION AND QUANTIFICATION, PCR, PLASMA
HCV QUANTITATIVE PCR: 688000 IU/ML — ABNORMAL HIGH
HCV QUANTITATIVE RNA LOG: 5.84 LOG10 — ABNORMAL HIGH

## 2019-10-16 ENCOUNTER — Ambulatory Visit (INDEPENDENT_AMBULATORY_CARE_PROVIDER_SITE_OTHER): Payer: Self-pay | Admitting: Specialist

## 2019-10-16 NOTE — Telephone Encounter (Signed)
Patient called in asking if her brace has came in yet that she got fitted for. Thank you.

## 2019-10-16 NOTE — Telephone Encounter (Signed)
Per [name], brace was covered at 100% and should be coming via [employer]. Pt informed.

## 2019-11-03 ENCOUNTER — Other Ambulatory Visit (INDEPENDENT_AMBULATORY_CARE_PROVIDER_SITE_OTHER): Payer: Self-pay | Admitting: Family

## 2019-11-03 DIAGNOSIS — Z8619 Personal history of other infectious and parasitic diseases: Secondary | ICD-10-CM

## 2019-11-03 MED ORDER — VALACYCLOVIR 1 GRAM TABLET
ORAL_TABLET | ORAL | 3 refills | Status: DC
Start: 2019-11-03 — End: 2019-12-03

## 2019-11-03 NOTE — Telephone Encounter (Signed)
Pt's pharmacy is requesting refill via fax.     Valacyclovir 1g     RX is pending for approval.

## 2019-11-05 ENCOUNTER — Ambulatory Visit (INDEPENDENT_AMBULATORY_CARE_PROVIDER_SITE_OTHER): Payer: Self-pay | Admitting: Specialist

## 2019-11-05 NOTE — Telephone Encounter (Signed)
Patient called to see if the knee brace was here yet.  Please call her with status [phone]    Denyse Amass, Office Staff  11/05/2019, 09:06

## 2019-11-05 NOTE — Telephone Encounter (Signed)
I called the patient and let her know that the brace has not came in yet and that the rep is working on it and that we would give her a call when it comes in.    Denton Meek, [place]

## 2019-11-10 ENCOUNTER — Other Ambulatory Visit (INDEPENDENT_AMBULATORY_CARE_PROVIDER_SITE_OTHER): Payer: Self-pay | Admitting: Family

## 2019-11-10 DIAGNOSIS — F32A Depression, unspecified: Secondary | ICD-10-CM

## 2019-11-10 NOTE — Telephone Encounter (Signed)
too soon to refill.

## 2019-11-13 ENCOUNTER — Other Ambulatory Visit: Payer: Self-pay

## 2019-11-13 ENCOUNTER — Ambulatory Visit: Payer: MEDICAID | Attending: Family Medicine

## 2019-11-13 DIAGNOSIS — Z79899 Other long term (current) drug therapy: Secondary | ICD-10-CM | POA: Insufficient documentation

## 2019-11-14 ENCOUNTER — Ambulatory Visit (INDEPENDENT_AMBULATORY_CARE_PROVIDER_SITE_OTHER): Payer: Self-pay | Admitting: Specialist

## 2019-11-14 NOTE — Telephone Encounter (Signed)
Patient was wondering if her knee brace has came in yet

## 2019-11-14 NOTE — Telephone Encounter (Signed)
Let her know that it has not came in yet.    Denton Meek, MA

## 2019-11-17 LAB — METHADONE, QUANTITATIVE, SERUM
METHADONE: 310 ng/mL (ref 100–400)
METHADONE: 520 ng/mL — ABNORMAL HIGH (ref 100–400)

## 2019-11-20 ENCOUNTER — Encounter (INDEPENDENT_AMBULATORY_CARE_PROVIDER_SITE_OTHER): Payer: Self-pay | Admitting: Family

## 2019-11-20 ENCOUNTER — Other Ambulatory Visit: Payer: Self-pay

## 2019-11-20 ENCOUNTER — Other Ambulatory Visit (INDEPENDENT_AMBULATORY_CARE_PROVIDER_SITE_OTHER): Payer: MEDICAID

## 2019-11-20 ENCOUNTER — Ambulatory Visit (INDEPENDENT_AMBULATORY_CARE_PROVIDER_SITE_OTHER): Payer: MEDICAID | Admitting: Family

## 2019-11-20 VITALS — BP 138/84 | HR 89 | Temp 97.1°F | Resp 16 | Ht 64.0 in | Wt 252.2 lb

## 2019-11-20 DIAGNOSIS — R635 Abnormal weight gain: Secondary | ICD-10-CM

## 2019-11-20 DIAGNOSIS — Z87898 Personal history of other specified conditions: Secondary | ICD-10-CM

## 2019-11-20 DIAGNOSIS — F32A Depression, unspecified: Secondary | ICD-10-CM

## 2019-11-20 DIAGNOSIS — F1991 Other psychoactive substance use, unspecified, in remission: Secondary | ICD-10-CM

## 2019-11-20 DIAGNOSIS — F419 Anxiety disorder, unspecified: Secondary | ICD-10-CM

## 2019-11-20 LAB — COMPREHENSIVE METABOLIC PANEL, NON-FASTING
ALBUMIN: 4.3 g/dL (ref 3.5–5.0)
ALKALINE PHOSPHATASE: 62 U/L (ref 38–126)
ALT (SGPT): 287 U/L — ABNORMAL HIGH (ref <=35)
ANION GAP: 8 mmol/L
AST (SGOT): 164 U/L — ABNORMAL HIGH (ref 14–36)
BILIRUBIN TOTAL: 0.5 mg/dL (ref 0.2–5.0)
BUN/CREA RATIO: 26
BUN: 20 mg/dL — ABNORMAL HIGH (ref 7–17)
CALCIUM: 9.5 mg/dL (ref 8.4–10.2)
CHLORIDE: 102 mmol/L (ref 98–107)
CO2 TOTAL: 28 mmol/L (ref 22–30)
CREATININE: 0.78 mg/dL (ref 0.52–1.04)
ESTIMATED GFR: 100 mL/min/{1.73_m2} (ref >=60)
GLUCOSE: 97 mg/dL (ref 74–106)
POTASSIUM: 4.7 mmol/L (ref 3.5–5.1)
PROTEIN TOTAL: 7.9 g/dL (ref 6.3–8.2)
SODIUM: 138 mmol/L (ref 137–145)

## 2019-11-20 LAB — HGA1C (HEMOGLOBIN A1C WITH EST AVG GLUCOSE)
ESTIMATED AVERAGE GLUCOSE: 100 mg/dL
HEMOGLOBIN A1C: 5.1 % (ref 4.3–6.1)

## 2019-11-20 MED ORDER — FLUOXETINE 10 MG CAPSULE
10.0000 mg | ORAL_CAPSULE | Freq: Every day | ORAL | 0 refills | Status: DC
Start: 2019-11-20 — End: 2019-12-22

## 2019-11-20 NOTE — Ancillary Notes (Signed)
Department of Community Practice     Venipuncture performed in office on right arm antecubital vein, dry pressure dressing was applied to site and patient tolerated it well.  Specimen was centrifuged, aliquoted as needed and specimen was labeled and packaged for transport.    Arnette Norris, PHLEBOTOMIST  11/20/2019, 09:40

## 2019-11-20 NOTE — Progress Notes (Unsigned)
[facility]  [address] [place] [address]    History and Physical     Name: Amanda Levine MRN:  [id number]   Date: 11/20/2019 Age: 34 y.o.       ROUTINE VISIT  Raoul Pitch, APRN,FNP-BC        PCP: Raoul Pitch, APRN,FNP-BC     Reason for Visit: Thirst and Depression    History of Present Illness  Amanda Levine is a 34 y.o. female who is being seen today for thirst, weight gain and depression.    Patient complains of unintentional weight gain.  She has gained 78lb in 7 months.  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  She is on methadone.  She is not working.  She is not exercising.  She is following no certain type of diet.  She reports feeling excessively thirsty and is afraid that she is not diabetic.  She had normal lab work completed in May.    Patient also follows up for depression.  She reports her symptoms are not well controlled.  CC is currently taking Wellbutrin and hydroxyzine as needed for acute anxiety.  She is following with class work for counseling.    Past Medical History:   Diagnosis Date   . Anxiety    . Convulsions (CMS HCC)     with IV contrast x 1   . Crohn's colitis (CMS HCC)    . Depression    . Ectopic pregnancy    . Elevated liver enzymes    . Genital herpes    . Gilbert's disease    . H/O urinary tract infection     1 month ago   . History of intravenous drug use in remission    . 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 Insomnia    . Oral herpes    . Sciatica    . Viral hepatitis    . Viral hepatitis C          Past Surgical History:   Procedure Laterality Date   . COLONOSCOPY  2016    [place]   . DILATION AND CURETTAGE, DIAGNOSTIC / THERAPEUTIC     . ECTOPIC PREGNANCY SURGERY     . HX CESAREAN SECTION      x 2   . HX TONSILLECTOMY     . HX TUBAL LIGATION     . KNEE SURGERY Left 07/09/2019    knee arthroscopy with partial medial meniscectomy and anterior cruciate ligament reconstruction bone to bone   .  LIVER BIOPSY  05/2019         Medication:  buPROPion (WELLBUTRIN XL) 150 mg extended release 24 hr tablet, Take 2 Tablets (300 mg total) by mouth Once a day for 90 days  busPIRone (BUSPAR) 15 mg Oral Tablet, Take 1 Tablet (15 mg total) by mouth Three times a day for 30 days  cyanocobalamin (VITAMIN B 12) 1,000 mcg Oral Tablet, Take 1,000 mcg by mouth Once a day  meloxicam (MOBIC) 7.5 mg Oral Tablet, TAKE 1 TABLET (7.5 MG TOTAL) BY MOUTH ONCE A DAY FOR 30 DAYS  methadone (DOLOPHINE) 5 mg/5 mL Oral Solution, Take 120 mg by mouth Once a day   oxybutynin (DITROPAN) 5 mg Oral Tablet, Take 5 mg by mouth Three times a day as needed   promethazine (PHENERGAN) 25 mg Oral Tablet, Take 1 Tablet by mouth  Every 4 hours as needed  valACYclovir (VALTREX) 1 gram Oral Tablet, TAKE 1 TABLET BY MOUTH EVERY DAY *PLAN LIMIT    No facility-administered medications prior to visit.    Allergies:  Allergies   Allergen Reactions   . Shellfish Derived Rash   . Amoxicillin    . Iv Contrast Seizure   . Seafood [Shrimp]        Family Medical History:     Problem Relation (Age of Onset)    Cancer Sister    Depression Sister    Heart Attack Father, Maternal Grandfather    Hypertension (High Blood Pressure) Mother, Father    Liver Disease Sister          Social History     Tobacco Use   . Smoking status: Current Every Day Smoker     Packs/day: 0.50     Types: Cigarettes   . Smokeless tobacco: Never Used   . Tobacco comment: instructed not to smoke after midnight the night before surgery   Vaping Use   . Vaping Use: Some days   Substance Use Topics   . Alcohol use: Not Currently   . Drug use: Not Currently     Types: Methamphetamines, Marijuana, Opioid, IV     Comment: hx of herion use---clean x 167 days       Review of Systems  Review of Systems   Constitutional: Positive for malaise/fatigue and weight gain. Negative for chills and fever.   Cardiovascular: Negative for chest pain, leg swelling and palpitations.   Respiratory: Negative for cough,  shortness of breath and wheezing.    Skin: Negative for itching, rash and suspicious lesions.   Gastrointestinal: Negative for abdominal pain, diarrhea, nausea and vomiting.   Neurological: Negative for dizziness and headaches.   Psychiatric/Behavioral: Positive for depression. Negative for substance abuse, suicidal ideas and thoughts of violence. The patient is nervous/anxious. The patient does not have insomnia.        Nursing Notes:   Mike Gip, RN  11/20/19 3244  Signed  Pt reports that recently she had an increased thirst. Pt is requesting lab work to check for diabetes.     Pt is also wanting to discuss changing her depression medication.       Physical Exam:  Vitals:    11/20/19 0857   BP: 138/84   Pulse: 89   Resp: 16   Temp: 36.2 C (97.1 F)   SpO2: 98%   Weight: 114 kg (252 lb 3.2 oz)   Height: 1.626 m (5\' 4" )   BMI: 43.38      Physical Exam  Constitutional:       Appearance: Normal appearance. She is obese. She is not ill-appearing.   Cardiovascular:      Rate and Rhythm: Regular rhythm.      Pulses: Normal pulses.      Heart sounds: Normal heart sounds.   Pulmonary:      Effort: Pulmonary effort is normal.      Breath sounds: Normal breath sounds.   Abdominal:      Tenderness: There is no abdominal tenderness.   Musculoskeletal:         General: No swelling.   Skin:     General: Skin is warm and dry.   Neurological:      Mental Status: She is oriented to person, place, and time.   Psychiatric:         Mood and Affect: Mood normal.  MDM    HEMOGLOBIN A1C  Lab Results   Component Value Date    HA1C 5.1 11/20/2019     COMPLETE BLOOD COUNT   Lab Results   Component Value Date    WBC 8.4 07/03/2019    HGB 13.4 07/03/2019    HCT 41.9 07/03/2019    PLTCNT 339 07/03/2019       DIFFERENTIAL  Lab Results   Component Value Date    PMNS 50 07/03/2019    MONOCYTES 11 07/03/2019    BASOPHILS 1 07/03/2019    BASOPHILS <0.10 07/03/2019    PMNABS 4.20 07/03/2019    LYMPHSABS 2.89 07/03/2019    EOSABS 0.31  07/03/2019    MONOSABS 0.96 07/03/2019     COMPREHENSIVE METABOLIC PANEL FASTING  Lab Results   Component Value Date    SODIUM 138 11/20/2019    POTASSIUM 4.7 11/20/2019    CHLORIDE 102 11/20/2019    CO2 28 11/20/2019    ANIONGAP 8 11/20/2019    BUN 20 (H) 11/20/2019    CREATININE 0.78 11/20/2019    CALCIUM 9.5 11/20/2019    ALBUMIN 4.3 11/20/2019    TOTALPROTEIN 7.9 11/20/2019    ALKPHOS 62 11/20/2019    AST 164 (H) 11/20/2019    ALT 287 (H) 11/20/2019     LIPID PROFILE  Lab Results   Component Value Date    CHOLESTEROL 208 (H) 04/09/2019    HDLCHOL 72 (H) 04/09/2019    LDLCHOL 121 04/09/2019    TRIG 74 04/09/2019          Assessment/Plan:  Problem List Items Addressed This Visit        Unprioritized    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  Encourage regular substance abuse counseling which patient is currently attending.           Anxiety and depression (Chronic)     Chronic, uncontrolled problem  Patient is established with multiple counselors and will continue as scheduled  Continue SNRI today  Start low-dose Prozac  Discussed medication side effect profile  Will follow-up with me in 4-6 weeks for symptom recheck         Relevant Medications    FLUoxetine (PROZAC) 10 mg Oral Capsule      Other Visit Diagnoses     Excessive weight gain    -  Primary  Chronic, uncontrolled problem  Will repeat labs today although I feel weight gain is related to her inactivity and likely over medication with methadone given her most recent peak and trough levels  Patient will follow up with current methadone prescribed her to review dosage and recent labs  Encourage increased her activity    Relevant Orders    COMPREHENSIVE METABOLIC PANEL, NON-FASTING (Completed)    HGA1C (HEMOGLOBIN A1C WITH EST AVG GLUCOSE) (Completed)     [facility]er to [facility]        Tobacco cessation counseling performed.       Follow up: Return in about 6 weeks (around 01/01/2020).  Seek medical attention for new or worsening symptoms.  Patient has been seen in this clinic within the last 3 years.     Raoul Pitch, APRN,FNP-BC      This note was partially created using MModal Fluency Direct system (voice recognition software) and is inherently  subject to errors including those of syntax and "sound-alike" substitutions which may escape proofreading.  In such instances, original meaning may be extrapolated by contextual derivation.

## 2019-11-20 NOTE — Nursing Note (Signed)
Pt reports that recently she had an increased thirst. Pt is requesting lab work to check for diabetes.     Pt is also wanting to discuss changing her depression medication.

## 2019-11-25 ENCOUNTER — Encounter (INDEPENDENT_AMBULATORY_CARE_PROVIDER_SITE_OTHER): Payer: Self-pay | Admitting: Specialist

## 2019-11-25 NOTE — Assessment & Plan Note (Signed)
Chronic, controlled problem  Congratulated patient on her decision for recovery  She should continue to follow with the [facility] treatment center and continue medication as prescribed.  Encourage regular substance abuse counseling which patient is currently attending.

## 2019-11-25 NOTE — Assessment & Plan Note (Signed)
Chronic, uncontrolled problem  Patient is established with multiple counselors and will continue as scheduled  Continue SNRI today  Start low-dose Prozac  Discussed medication side effect profile  Will follow-up with me in 4-6 weeks for symptom recheck

## 2019-11-26 ENCOUNTER — Other Ambulatory Visit (INDEPENDENT_AMBULATORY_CARE_PROVIDER_SITE_OTHER): Payer: Self-pay | Admitting: Specialist

## 2019-11-26 DIAGNOSIS — M1712 Unilateral primary osteoarthritis, left knee: Secondary | ICD-10-CM

## 2019-11-26 DIAGNOSIS — M2392 Unspecified internal derangement of left knee: Secondary | ICD-10-CM

## 2019-11-26 NOTE — Telephone Encounter (Signed)
Status post left knee scope with ACL reconstruction on 07/09/19. Requesting refill of Meloxicam - last received #30 with 1 refill on 09/26/19. Thanks!

## 2019-11-28 ENCOUNTER — Encounter (INDEPENDENT_AMBULATORY_CARE_PROVIDER_SITE_OTHER): Payer: Self-pay | Admitting: Family

## 2019-12-01 ENCOUNTER — Other Ambulatory Visit (INDEPENDENT_AMBULATORY_CARE_PROVIDER_SITE_OTHER): Payer: Self-pay | Admitting: Family

## 2019-12-01 DIAGNOSIS — Z8619 Personal history of other infectious and parasitic diseases: Secondary | ICD-10-CM

## 2019-12-01 NOTE — Telephone Encounter (Signed)
This is an outbreak dose -- to take when she has an outbreak.  If she wants suppressive treatment to take every day to prevent breakouts, we need to change the dose.

## 2019-12-01 NOTE — Telephone Encounter (Signed)
-----   Message from Sharkey. Muzquiz sent at 11/28/2019 12:01 PM EST -----  Regarding: Refill on med  I dont have any refills left on my valacyclovir

## 2019-12-01 NOTE — Telephone Encounter (Signed)
rx is pending to local pharmacy.

## 2019-12-02 ENCOUNTER — Encounter (INDEPENDENT_AMBULATORY_CARE_PROVIDER_SITE_OTHER): Payer: Self-pay | Admitting: Family

## 2019-12-02 ENCOUNTER — Other Ambulatory Visit (INDEPENDENT_AMBULATORY_CARE_PROVIDER_SITE_OTHER): Payer: Self-pay | Admitting: Family

## 2019-12-02 DIAGNOSIS — Z8619 Personal history of other infectious and parasitic diseases: Secondary | ICD-10-CM

## 2019-12-02 NOTE — Telephone Encounter (Signed)
Last scheduled appointment with you was 11/20/2019. No future appt scheduled at this time.     [address] [place], [place]  12/02/2019, 13:34

## 2019-12-02 NOTE — Telephone Encounter (Signed)
-----   Message from Katara L. Mcgaughey sent at 12/02/2019  8:24 AM EST -----  Regarding: [facility] refill  I dont have any more refills left on my valacyclovir HCL 1 gram tablets

## 2019-12-03 ENCOUNTER — Other Ambulatory Visit (INDEPENDENT_AMBULATORY_CARE_PROVIDER_SITE_OTHER): Payer: Self-pay | Admitting: Family

## 2019-12-03 ENCOUNTER — Telehealth (INDEPENDENT_AMBULATORY_CARE_PROVIDER_SITE_OTHER): Payer: Self-pay | Admitting: Family

## 2019-12-03 DIAGNOSIS — Z8619 Personal history of other infectious and parasitic diseases: Secondary | ICD-10-CM

## 2019-12-03 MED ORDER — VALACYCLOVIR 1 GRAM TABLET
500.0000 mg | ORAL_TABLET | Freq: Every day | ORAL | 3 refills | Status: DC
Start: 2019-12-03 — End: 2019-12-05

## 2019-12-03 NOTE — Telephone Encounter (Signed)
-----   Message from Shakara L. Galik sent at 12/03/2019 11:47 AM EST -----  Regarding: [facility] refill  My bottle says take 1 1gram pill every day by mouth. An it's a 7 day supply..I been having to refill them   every 7 days

## 2019-12-05 ENCOUNTER — Other Ambulatory Visit (INDEPENDENT_AMBULATORY_CARE_PROVIDER_SITE_OTHER): Payer: Self-pay | Admitting: Family

## 2019-12-05 DIAGNOSIS — Z8619 Personal history of other infectious and parasitic diseases: Secondary | ICD-10-CM

## 2019-12-05 MED ORDER — VALACYCLOVIR 1 GRAM TABLET
500.0000 mg | ORAL_TABLET | Freq: Every day | ORAL | 3 refills | Status: DC
Start: 2019-12-05 — End: 2020-03-03

## 2019-12-05 NOTE — Telephone Encounter (Signed)
rx pending with new directions to take 1/2 tablet daily.

## 2019-12-05 NOTE — Telephone Encounter (Signed)
-----   Message from Jesly L. Dubas sent at 12/04/2019 11:58 AM EST -----  Regarding: [facility] refill  I called them an they said they sent back an asked what u all ment  because it said with with dosing option's or something so they didnt know weather to put the instructions as 1 whole gram pill 1 times a day or a half on or what so I needed to call u guys back an see what was up

## 2019-12-09 ENCOUNTER — Other Ambulatory Visit: Payer: Self-pay

## 2019-12-09 ENCOUNTER — Other Ambulatory Visit: Payer: MEDICAID | Attending: Nurse Practitioner | Admitting: Nurse Practitioner

## 2019-12-09 ENCOUNTER — Other Ambulatory Visit (INDEPENDENT_AMBULATORY_CARE_PROVIDER_SITE_OTHER): Payer: MEDICAID

## 2019-12-09 DIAGNOSIS — K5090 Crohn's disease, unspecified, without complications: Secondary | ICD-10-CM | POA: Insufficient documentation

## 2019-12-09 DIAGNOSIS — R198 Other specified symptoms and signs involving the digestive system and abdomen: Secondary | ICD-10-CM | POA: Insufficient documentation

## 2019-12-10 ENCOUNTER — Other Ambulatory Visit (INDEPENDENT_AMBULATORY_CARE_PROVIDER_SITE_OTHER): Payer: Self-pay | Admitting: Family

## 2019-12-10 ENCOUNTER — Ambulatory Visit (INDEPENDENT_AMBULATORY_CARE_PROVIDER_SITE_OTHER): Payer: MEDICAID | Admitting: Family

## 2019-12-10 DIAGNOSIS — F32A Depression, unspecified: Secondary | ICD-10-CM

## 2019-12-10 LAB — E. COLI SHIGA TOXIN
SHIGA TOXIN 1: NEGATIVE
SHIGA TOXIN 2: NEGATIVE

## 2019-12-11 LAB — ROUTINE STOOL CULTURE (INCLUDING E. COLI SHIGA TOXIN)

## 2019-12-12 ENCOUNTER — Telehealth (INDEPENDENT_AMBULATORY_CARE_PROVIDER_SITE_OTHER): Payer: Self-pay | Admitting: Family

## 2019-12-12 MED ORDER — HYDROCORTISONE ACETATE 10 % (80 MG) RECTAL FOAM
1.0000 | Freq: Two times a day (BID) | RECTAL | 1 refills | Status: AC
Start: 2019-12-12 — End: 2019-12-19

## 2019-12-12 NOTE — Telephone Encounter (Signed)
-----   Message from Alandria L. Latchford sent at 12/12/2019 12:39 PM EST -----  Regarding: [facility] refill  Is there any way that Shanda Bumps can call me in some hemorrhoid medican. I just talked to [facility] office yesterday an they are gonna refer me to a place to eventually have the hemorrhoid removed but till then can she prescribe me some cream or medican or something

## 2019-12-12 NOTE — Telephone Encounter (Signed)
Sent rx.

## 2019-12-12 NOTE — Telephone Encounter (Signed)
Pt notified via MyChart message. Mike Gip, RN  12/12/2019, 13:37

## 2019-12-18 ENCOUNTER — Encounter (INDEPENDENT_AMBULATORY_CARE_PROVIDER_SITE_OTHER): Payer: MEDICAID | Admitting: Specialist

## 2019-12-20 ENCOUNTER — Other Ambulatory Visit (INDEPENDENT_AMBULATORY_CARE_PROVIDER_SITE_OTHER): Payer: Self-pay | Admitting: Family

## 2019-12-20 DIAGNOSIS — F419 Anxiety disorder, unspecified: Secondary | ICD-10-CM

## 2019-12-22 NOTE — Telephone Encounter (Signed)
Pharmacy requesting refill.     last office visit was 11/20/19  next office visit is not scheduled yet  last rx: Wellbutrin : 09/22/19 #180 NRF             Prozac to soon to refill last rx: 11/20/19 #90    Mike Gip, RN  12/22/2019, 08:25

## 2019-12-24 ENCOUNTER — Other Ambulatory Visit (INDEPENDENT_AMBULATORY_CARE_PROVIDER_SITE_OTHER): Payer: Self-pay | Admitting: Family

## 2019-12-24 DIAGNOSIS — F32A Depression, unspecified: Secondary | ICD-10-CM

## 2019-12-31 ENCOUNTER — Other Ambulatory Visit (INDEPENDENT_AMBULATORY_CARE_PROVIDER_SITE_OTHER): Payer: Self-pay | Admitting: Specialist

## 2019-12-31 DIAGNOSIS — M1712 Unilateral primary osteoarthritis, left knee: Secondary | ICD-10-CM

## 2019-12-31 DIAGNOSIS — M2392 Unspecified internal derangement of left knee: Secondary | ICD-10-CM

## 2019-12-31 NOTE — Telephone Encounter (Signed)
Requesting refill of Meloxicam - last received #30 with 1 refill on 11/26/19. Thanks!

## 2020-01-10 ENCOUNTER — Other Ambulatory Visit (INDEPENDENT_AMBULATORY_CARE_PROVIDER_SITE_OTHER): Payer: Self-pay | Admitting: Family

## 2020-01-10 DIAGNOSIS — F32A Depression, unspecified: Secondary | ICD-10-CM

## 2020-01-10 DIAGNOSIS — F419 Anxiety disorder, unspecified: Secondary | ICD-10-CM

## 2020-01-12 NOTE — Telephone Encounter (Signed)
last rx was 12/22/19 #180 NRF

## 2020-01-18 ENCOUNTER — Encounter (INDEPENDENT_AMBULATORY_CARE_PROVIDER_SITE_OTHER): Payer: Self-pay | Admitting: Family

## 2020-01-19 ENCOUNTER — Other Ambulatory Visit (INDEPENDENT_AMBULATORY_CARE_PROVIDER_SITE_OTHER): Payer: Self-pay | Admitting: Family

## 2020-01-19 DIAGNOSIS — F32A Depression, unspecified: Secondary | ICD-10-CM

## 2020-01-19 DIAGNOSIS — M2392 Unspecified internal derangement of left knee: Secondary | ICD-10-CM

## 2020-01-19 DIAGNOSIS — F419 Anxiety disorder, unspecified: Secondary | ICD-10-CM

## 2020-01-19 DIAGNOSIS — M1712 Unilateral primary osteoarthritis, left knee: Secondary | ICD-10-CM

## 2020-01-19 NOTE — Telephone Encounter (Signed)
-----   Message from Monaye L. Grosvenor sent at 01/18/2020  2:32 PM EST -----  Regarding: Refill on meds  I am almost out of my Prozac, sarafem  (fluoxetine HCL 10mg  capsule) can someone please call me in a refill on them to the [facility] on [address] an I'm also out of my Mobic (meloxican 7.5mg  tablet) can  u also refill them as well to the same pharmacy the [facility] on [address].

## 2020-01-20 MED ORDER — MELOXICAM 7.5 MG TABLET
ORAL_TABLET | ORAL | 2 refills | Status: DC
Start: 2020-01-20 — End: 2020-10-21

## 2020-01-20 MED ORDER — FLUOXETINE 10 MG CAPSULE
10.0000 mg | ORAL_CAPSULE | Freq: Every day | ORAL | 3 refills | Status: DC
Start: 2020-01-20 — End: 2020-09-24

## 2020-02-04 ENCOUNTER — Other Ambulatory Visit: Payer: Self-pay

## 2020-02-04 ENCOUNTER — Ambulatory Visit: Payer: MEDICAID | Attending: INFECTIOUS DISEASE

## 2020-02-04 DIAGNOSIS — B182 Chronic viral hepatitis C: Secondary | ICD-10-CM | POA: Insufficient documentation

## 2020-02-04 LAB — COMPREHENSIVE METABOLIC PANEL, NON-FASTING
ALBUMIN: 3.6 g/dL (ref 3.5–5.0)
ALKALINE PHOSPHATASE: 57 U/L (ref 40–110)
ALT (SGPT): 16 U/L (ref 8–22)
ANION GAP: 7 mmol/L (ref 4–13)
AST (SGOT): 21 U/L (ref 8–45)
BILIRUBIN TOTAL: 0.5 mg/dL (ref 0.3–1.3)
BUN/CREA RATIO: 15 (ref 6–22)
BUN: 12 mg/dL (ref 8–25)
CALCIUM: 9.4 mg/dL (ref 8.5–10.0)
CHLORIDE: 105 mmol/L (ref 96–111)
CO2 TOTAL: 26 mmol/L (ref 22–30)
CREATININE: 0.81 mg/dL (ref 0.60–1.05)
ESTIMATED GFR: >90 mL/min/BSA (ref >=60)
GLUCOSE: 78 mg/dL (ref 65–125)
POTASSIUM: 4.1 mmol/L (ref 3.5–5.1)
PROTEIN TOTAL: 7.7 g/dL (ref 6.4–8.3)
SODIUM: 138 mmol/L (ref 136–145)

## 2020-02-04 LAB — CBC
HCT: 45.4 % (ref 34.8–46.0)
HGB: 14.8 g/dL (ref 11.5–16.0)
MCH: 30.8 pg (ref 26.0–32.0)
MCHC: 32.6 g/dL (ref 31.0–35.5)
MCV: 94.4 fL (ref 78.0–100.0)
MPV: 9.4 fL (ref 8.7–12.5)
PLATELETS: 338 10*3/uL (ref 150–400)
RBC: 4.81 10*6/uL (ref 3.85–5.22)
RDW-CV: 12.6 % (ref 11.5–15.5)
WBC: 12.2 10*3/uL — ABNORMAL HIGH (ref 3.7–11.0)

## 2020-02-05 LAB — HEPATITIS C VIRUS (HCV) RNA DETECTION AND QUANTIFICATION, PCR, PLASMA
HCV QUANTITATIVE PCR: <15 IU/ML — ABNORMAL HIGH
HCV QUANTITATIVE RNA LOG: <1.18 LOG10 — ABNORMAL HIGH

## 2020-02-26 NOTE — H&P (Deleted)
[facility]  [address]  [place] [address]  [phone]    Progress Note  Name: Amanda Levine  MRN: [id number]  DOB: 1985-09-22  Age: 35 y.o.  Date: 02/27/2020    Reason for Visit: No chief complaint on file.    History of Present Illness:  Amanda Levine is a 35 y.o. female who presents today for ***.    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  05/2019 ANA- positive, CBC/AMA/ASMA/H.pylori/TTG-negative, ALT 316, AST 157   04/2019 Fibrotest: F0/A3, HCV PCR 866000, HAV IgG-negative, HBsAb-reactive, HBsAg-negative, AST 152, ALT 207  Liver ultrasound: tiny adherent stones or gallbladder polyps without signs of cholecystitis, moderate dilation of CBD to 93mm, recommend MRCP vs ERCP vs CT, normal liver appearance    Elevated LFTs/crohns/hep C- needs treated for Hep C through [facility] t/c treatment for autoimmune hepatitis, request colonoscopy, stool studies, t/c pred or mesalamine, t/c repeat colonoscopy, cont miralax    Denies fevers, chills, nausea, vomiting, heartburn, reflux, melena, hematochezia, or unintentional weight loss.      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    Insomnia     Oral herpes     Sciatica     Viral hepatitis     Viral hepatitis C          Past Surgical History:   Procedure Laterality Date    COLONOSCOPY  2016    [place]    DILATION AND CURETTAGE, DIAGNOSTIC / THERAPEUTIC      ECTOPIC PREGNANCY SURGERY      HX CESAREAN SECTION      x 2    HX TONSILLECTOMY      HX TUBAL LIGATION       KNEE SURGERY Left 07/09/2019    knee arthroscopy with partial medial meniscectomy and anterior cruciate ligament reconstruction bone to bone    LIVER BIOPSY  05/2019         Current Outpatient Medications   Medication Sig    buPROPion (WELLBUTRIN XL) 150 mg extended release 24 hr tablet TAKE 2 TABLETS BY MOUTH ONCE DAILY    busPIRone (BUSPAR) 15 mg Oral Tablet Take 1 Tablet (15 mg total) by mouth Three times a day for 30 days    cyanocobalamin (VITAMIN B 12) 1,000 mcg Oral Tablet Take 1,000 mcg by mouth Once a day    FLUoxetine (PROZAC) 10 mg Oral Capsule Take 1 Capsule (10 mg total) by mouth Once a day    meloxicam (MOBIC) 7.5 mg Oral Tablet TAKE 1 TABLET BY MOUTH EVERY DAY    methadone (DOLOPHINE) 5 mg/5 mL Oral Solution Take 120 mg by mouth Once a day     promethazine (PHENERGAN) 25 mg Oral Tablet Take 1 Tablet by mouth Every 4 hours as needed    valACYclovir (VALTREX) 1  gram Oral Tablet Take 0.5 Tablets (0.5 g total) by mouth Once a day     Allergies   Allergen Reactions    Shellfish Derived Rash    Amoxicillin     Iv Contrast Seizure    Seafood [Shrimp]      Family Medical History:     Problem Relation (Age of Onset)    Cancer Sister    Depression Sister    Heart Attack Father, Maternal Grandfather    Hypertension (High Blood Pressure) Mother, Father    Liver Disease Sister          Social History     Socioeconomic History    Marital status: Legally Separated     Spouse name: Not on file    Number of children: Not on file    Years of education: Not on file    Highest education level: Not on file   Occupational History    Not on file   Tobacco Use    Smoking status: Current Every Day Smoker     Packs/day: 0.50     Types: Cigarettes    Smokeless tobacco: Never Used    Tobacco comment: instructed not to smoke after midnight the night before surgery   Vaping Use    Vaping Use: Some days   Substance and Sexual Activity    Alcohol use: Not Currently    Drug use: Not Currently     Types:  Methamphetamines, Marijuana, Opioid, IV     Comment: hx of herion use---clean x 167 days    Sexual activity: Yes     Partners: Male     Birth control/protection: Female Sterilization   Other Topics Concern    Ability to Walk 1 Flight of Steps without SOB/CP No     Comment: "a little winded"    Routine Exercise Not Asked    Ability to Walk 2 Flight of Steps without SOB/CP Not Asked    Unable to Ambulate Not Asked    Total Care Not Asked    Ability To Do Own ADL's Yes    Uses Walker Not Asked    Other Activity Level Not Asked    Uses Cane Not Asked   Social History Narrative    Not on file     Social Determinants of Health     Financial Resource Strain: Not on file   Food Insecurity: Not on file   Transportation Needs: Not on file   Physical Activity: Not on file   Stress: Not on file   Intimate Partner Violence: Not on file   Housing Stability: Not on file     Review of Systems:                                      All other review of systems negative    Physical Exam:  There were no vitals taken for this visit.      Physical Exam  Constitutional:       General: She is not in acute distress.     Appearance: She is not diaphoretic.   HENT:      Head: Normocephalic.   Eyes:      Pupils: Pupils are equal, round, and reactive to light.   Cardiovascular:      Rate and Rhythm: Normal rate and regular rhythm.      Heart sounds: Normal heart sounds. No murmur heard.  Pulmonary:      Effort: Pulmonary effort is normal. No respiratory distress.      Breath sounds: Normal breath sounds. No wheezing.   Abdominal:      General: Bowel sounds are normal. There is no distension.      Palpations: Abdomen is soft. There is no mass.      Tenderness: There is no abdominal tenderness. There is no guarding or rebound.   Musculoskeletal:         General: Normal range of motion.   Skin:     General: Skin is warm and dry.      Findings: No erythema or rash.   Neurological:      Mental Status: She is alert and oriented to person,  place, and time.   Psychiatric:         Judgment: Judgment normal.       Assessment and Plan:      Tama Headings, FNP-C  02/26/2020, 08:17    This note may have been partially generated using MModal Fluency Direct system, and there may be some incorrect words, spellings, and punctuation that were not noted in checking the note before saving, though effort was made to avoid such errors.

## 2020-02-27 ENCOUNTER — Ambulatory Visit (HOSPITAL_BASED_OUTPATIENT_CLINIC_OR_DEPARTMENT_OTHER): Payer: MEDICAID | Admitting: Nurse Practitioner

## 2020-03-03 ENCOUNTER — Encounter (INDEPENDENT_AMBULATORY_CARE_PROVIDER_SITE_OTHER): Payer: Self-pay | Admitting: Family

## 2020-03-03 ENCOUNTER — Other Ambulatory Visit (INDEPENDENT_AMBULATORY_CARE_PROVIDER_SITE_OTHER): Payer: Self-pay | Admitting: Family

## 2020-03-03 DIAGNOSIS — Z8619 Personal history of other infectious and parasitic diseases: Secondary | ICD-10-CM

## 2020-03-03 MED ORDER — VALACYCLOVIR 500 MG TABLET
500.0000 mg | ORAL_TABLET | Freq: Every day | ORAL | 3 refills | Status: DC
Start: 2020-03-03 — End: 2020-06-08

## 2020-03-03 NOTE — Telephone Encounter (Signed)
I spoke with pharmacy. Pt's insurance will not approve any further refills on current prescription. They recommended we try to send a new prescription to the pharmacy. RX is pending.     Mike Gip, RN  03/03/2020, 13:26

## 2020-03-03 NOTE — Telephone Encounter (Signed)
-----   Message from Milica L. Ridener sent at 03/03/2020 10:43 AM EST -----  Regarding: Herpes meds???  I ran out of my herpes meds so I called them in to refill them bc I still have like 40 sum refills an when I get to [facility] they told me there was no refills left so then I show them my bottle an then they tell me that my insurance wont cover it now but cant tell me as to why..so is there another med I can take in place of that or something an u all call it in or what do I do

## 2020-03-09 ENCOUNTER — Other Ambulatory Visit (INDEPENDENT_AMBULATORY_CARE_PROVIDER_SITE_OTHER): Payer: Self-pay | Admitting: Family

## 2020-03-09 DIAGNOSIS — F419 Anxiety disorder, unspecified: Secondary | ICD-10-CM

## 2020-03-09 NOTE — Telephone Encounter (Signed)
Pharmacy requesting refill.     last office visit was 11/20/19  next office visit is not yet scheduled  last rx was 08/12/19 #[id number]    Mike Gip, RN  03/09/2020, 08:08

## 2020-03-10 ENCOUNTER — Encounter (INDEPENDENT_AMBULATORY_CARE_PROVIDER_SITE_OTHER): Payer: Self-pay | Admitting: Family

## 2020-03-17 ENCOUNTER — Other Ambulatory Visit (INDEPENDENT_AMBULATORY_CARE_PROVIDER_SITE_OTHER): Payer: Self-pay | Admitting: Family

## 2020-03-17 DIAGNOSIS — F32A Depression, unspecified: Secondary | ICD-10-CM

## 2020-03-17 NOTE — Telephone Encounter (Signed)
Pharmacy requesting refill.     last office visit was 11/20/19  next office is not yet scheduled  last rx was 12/22/19 #[id number] NRF    Mike Gip, RN  03/17/2020, 10:02

## 2020-03-18 ENCOUNTER — Other Ambulatory Visit: Payer: Self-pay

## 2020-03-18 ENCOUNTER — Ambulatory Visit: Payer: MEDICAID | Attending: PHYSICIAN ASSISTANT

## 2020-03-18 DIAGNOSIS — B182 Chronic viral hepatitis C: Secondary | ICD-10-CM

## 2020-03-18 LAB — COMPREHENSIVE METABOLIC PANEL, NON-FASTING
ALBUMIN: 3.4 g/dL — ABNORMAL LOW (ref 3.5–5.0)
ALKALINE PHOSPHATASE: 60 U/L (ref 40–110)
ALT (SGPT): 16 U/L (ref 8–22)
ANION GAP: 8 mmol/L (ref 4–13)
AST (SGOT): 22 U/L (ref 8–45)
BILIRUBIN TOTAL: 0.3 mg/dL (ref 0.3–1.3)
BUN/CREA RATIO: 21 (ref 6–22)
BUN: 16 mg/dL (ref 8–25)
CALCIUM: 9 mg/dL (ref 8.5–10.0)
CHLORIDE: 106 mmol/L (ref 96–111)
CO2 TOTAL: 25 mmol/L (ref 22–30)
CREATININE: 0.77 mg/dL (ref 0.60–1.05)
ESTIMATED GFR: >90 mL/min/BSA (ref >=60)
GLUCOSE: 77 mg/dL (ref 65–125)
POTASSIUM: 4.4 mmol/L (ref 3.5–5.1)
PROTEIN TOTAL: 6.9 g/dL (ref 6.4–8.3)
SODIUM: 139 mmol/L (ref 136–145)

## 2020-03-18 LAB — CBC WITH DIFF
BASOPHIL #: <0.1 10*3/uL (ref <=0.20)
BASOPHIL %: 0 %
EOSINOPHIL #: 0.59 10*3/uL — ABNORMAL HIGH (ref <=0.50)
EOSINOPHIL %: 6 %
HCT: 42.5 % (ref 34.8–46.0)
HGB: 13.6 g/dL (ref 11.5–16.0)
IMMATURE GRANULOCYTE #: <0.1 10*3/uL (ref <0.10)
IMMATURE GRANULOCYTE %: 0 % (ref 0–1)
LYMPHOCYTE #: 3.44 10*3/uL (ref 1.00–4.80)
LYMPHOCYTE %: 33 %
MCH: 30.4 pg (ref 26.0–32.0)
MCHC: 32 g/dL (ref 31.0–35.5)
MCV: 94.9 fL (ref 78.0–100.0)
MONOCYTE #: 0.87 10*3/uL (ref 0.20–1.10)
MONOCYTE %: 8 %
MPV: 9.7 fL (ref 8.7–12.5)
NEUTROPHIL #: 5.52 10*3/uL (ref 1.50–7.70)
NEUTROPHIL %: 53 %
PLATELETS: 335 10*3/uL (ref 150–400)
RBC: 4.48 10*6/uL (ref 3.85–5.22)
RDW-CV: 13.3 % (ref 11.5–15.5)
WBC: 10.5 10*3/uL (ref 3.7–11.0)

## 2020-03-19 LAB — HEPATITIS C VIRUS (HCV) RNA DETECTION AND QUANTIFICATION, PCR, PLASMA: HCV QUANTITATIVE PCR: NOT DETECTED

## 2020-03-30 ENCOUNTER — Encounter (INDEPENDENT_AMBULATORY_CARE_PROVIDER_SITE_OTHER): Payer: Self-pay | Admitting: Family

## 2020-04-01 ENCOUNTER — Ambulatory Visit (INDEPENDENT_AMBULATORY_CARE_PROVIDER_SITE_OTHER): Payer: MEDICAID | Admitting: Family

## 2020-04-06 ENCOUNTER — Encounter (INDEPENDENT_AMBULATORY_CARE_PROVIDER_SITE_OTHER): Payer: MEDICAID | Admitting: Family

## 2020-04-09 ENCOUNTER — Other Ambulatory Visit (HOSPITAL_COMMUNITY): Payer: MEDICAID | Admitting: Family

## 2020-04-09 ENCOUNTER — Ambulatory Visit: Payer: MEDICAID | Attending: INFECTIOUS DISEASE

## 2020-04-09 ENCOUNTER — Ambulatory Visit (INDEPENDENT_AMBULATORY_CARE_PROVIDER_SITE_OTHER): Payer: MEDICAID | Admitting: Family

## 2020-04-09 ENCOUNTER — Other Ambulatory Visit (INDEPENDENT_AMBULATORY_CARE_PROVIDER_SITE_OTHER): Payer: MEDICAID

## 2020-04-09 ENCOUNTER — Encounter (INDEPENDENT_AMBULATORY_CARE_PROVIDER_SITE_OTHER): Payer: Self-pay | Admitting: Family

## 2020-04-09 ENCOUNTER — Other Ambulatory Visit: Payer: Self-pay

## 2020-04-09 VITALS — BP 138/86 | HR 98 | Temp 97.1°F | Resp 16 | Ht 64.0 in | Wt 286.2 lb

## 2020-04-09 DIAGNOSIS — N898 Other specified noninflammatory disorders of vagina: Secondary | ICD-10-CM

## 2020-04-09 DIAGNOSIS — B182 Chronic viral hepatitis C: Secondary | ICD-10-CM | POA: Insufficient documentation

## 2020-04-09 DIAGNOSIS — N9089 Other specified noninflammatory disorders of vulva and perineum: Secondary | ICD-10-CM

## 2020-04-09 DIAGNOSIS — Z6841 Body Mass Index (BMI) 40.0 and over, adult: Secondary | ICD-10-CM

## 2020-04-09 LAB — CBC WITH DIFF
BASOPHIL #: <0.1 10*3/uL (ref <=0.20)
BASOPHIL %: 0 %
EOSINOPHIL #: 0.43 10*3/uL (ref <=0.50)
EOSINOPHIL %: 4 %
HCT: 42.5 % (ref 34.8–46.0)
HGB: 14.1 g/dL (ref 11.5–16.0)
IMMATURE GRANULOCYTE #: <0.1 10*3/uL (ref <0.10)
IMMATURE GRANULOCYTE %: 1 % (ref 0–1)
LYMPHOCYTE #: 3.56 10*3/uL (ref 1.00–4.80)
LYMPHOCYTE %: 31 %
MCH: 30.9 pg (ref 26.0–32.0)
MCHC: 33.2 g/dL (ref 31.0–35.5)
MCV: 93 fL (ref 78.0–100.0)
MONOCYTE #: 1.06 10*3/uL (ref 0.20–1.10)
MONOCYTE %: 9 %
MPV: 9.9 fL (ref 8.7–12.5)
NEUTROPHIL #: 6.13 10*3/uL (ref 1.50–7.70)
NEUTROPHIL %: 55 %
PLATELETS: 346 10*3/uL (ref 150–400)
RBC: 4.57 10*6/uL (ref 3.85–5.22)
RDW-CV: 13 % (ref 11.5–15.5)
WBC: 11.3 10*3/uL — ABNORMAL HIGH (ref 3.7–11.0)

## 2020-04-09 LAB — URINALYSIS, MACRO/MICRO
BILIRUBIN: NEGATIVE mg/dL
BLOOD: NEGATIVE mg/dL
GLUCOSE: NEGATIVE mg/dL
KETONES: NEGATIVE mg/dL
NITRITE: NEGATIVE
PH: 6 (ref 5.0–8.0)
PROTEIN: NEGATIVE mg/dL
SPECIFIC GRAVITY: 1.025 (ref 1.005–1.030)
UROBILINOGEN: 0.2 mg/dL

## 2020-04-09 LAB — COMPREHENSIVE METABOLIC PANEL, NON-FASTING
ALBUMIN: 3.5 g/dL (ref 3.5–5.0)
ALKALINE PHOSPHATASE: 67 U/L (ref 40–110)
ALT (SGPT): 15 U/L (ref 8–22)
ANION GAP: 10 mmol/L (ref 4–13)
AST (SGOT): 19 U/L (ref 8–45)
BILIRUBIN TOTAL: 0.4 mg/dL (ref 0.3–1.3)
BUN/CREA RATIO: 15 (ref 6–22)
BUN: 12 mg/dL (ref 8–25)
CALCIUM: 9.6 mg/dL (ref 8.5–10.0)
CHLORIDE: 101 mmol/L (ref 96–111)
CO2 TOTAL: 25 mmol/L (ref 22–30)
CREATININE: 0.81 mg/dL (ref 0.60–1.05)
ESTIMATED GFR: >90 mL/min/BSA (ref >=60)
GLUCOSE: 103 mg/dL (ref 65–125)
POTASSIUM: 4.2 mmol/L (ref 3.5–5.1)
PROTEIN TOTAL: 7.2 g/dL (ref 6.4–8.3)
SODIUM: 136 mmol/L (ref 136–145)

## 2020-04-09 LAB — WETMOUNT
TRICHOMONAS: ABSENT
YEAST: ABSENT

## 2020-04-09 LAB — URINALYSIS, MICROSCOPIC

## 2020-04-09 LAB — KOH PREP: KOH PREP: NONE SEEN

## 2020-04-09 MED ORDER — CONJUGATED ESTROGENS 0.625 MG/GRAM VAGINAL CREAM
0.5000 g | TOPICAL_CREAM | Freq: Every evening | VAGINAL | 0 refills | Status: DC
Start: 2020-04-09 — End: 2020-07-27

## 2020-04-09 MED ORDER — METRONIDAZOLE 500 MG TABLET
500.0000 mg | ORAL_TABLET | Freq: Two times a day (BID) | ORAL | 0 refills | Status: AC
Start: 2020-04-09 — End: 2020-04-16

## 2020-04-09 NOTE — Nursing Note (Signed)
Pt is c/o small amount of bloody discharge vaginal d/c for the last month, pain, and a knot on her left labia.

## 2020-04-09 NOTE — Progress Notes (Signed)
[facility], [facility]- [facility]  [address] [place] [address]     PROGRESS NOTE  Raoul Pitch, APRN,FNP-BC       Name: Amanda Levine MRN:  [id number]   Date: 04/09/2020 Age: 35 y.o.       PCP: Raoul Pitch, APRN,FNP-BC     Reason for Visit: Vaginal Discharge    History of Present Illness  Amanda Levine is a 35 y.o. female who is being seen today for vaginal discharge.     Patient complains of vaginal pain and increased vaginal discharge.  She reports discharge is white and sometimes bloody.  She has pain with intercourse.  She reports her labia are swollen and painful. She is not treating with anything over the counter at this time. She does have a history of Trich.  She currently has one female partner.       Past Medical History:   Diagnosis Date   . Anxiety    . Convulsions (CMS HCC)     with IV contrast x 1   . Crohn's colitis (CMS HCC)    . Depression    . Ectopic pregnancy    . Elevated liver enzymes    . Genital herpes    . Gilbert's disease    . H/O urinary tract infection     1 month ago   . History of intravenous drug use in remission    . 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 Insomnia    . Oral herpes    . Sciatica    . Viral hepatitis    . Viral hepatitis C          Past Surgical History:   Procedure Laterality Date   . COLONOSCOPY  2016    [place]   . DILATION AND CURETTAGE, DIAGNOSTIC / THERAPEUTIC     . ECTOPIC PREGNANCY SURGERY     . HX CESAREAN SECTION      x 2   . HX TONSILLECTOMY     . HX TUBAL LIGATION     . KNEE SURGERY Left 07/09/2019    knee arthroscopy with partial medial meniscectomy and anterior cruciate ligament reconstruction bone to bone   . LIVER BIOPSY  05/2019         Medication:  buPROPion (WELLBUTRIN XL) 150 mg extended release 24 hr tablet, Take 2 Tablets (300 mg total) by mouth Once a day for 180 days  busPIRone (BUSPAR) 15 mg Oral Tablet, TAKE 1 TABLET (15 MG TOTAL) BY MOUTH THREE TIMES A DAY FOR 30 DAYS  cyanocobalamin (VITAMIN B 12)  1,000 mcg Oral Tablet, Take 1,000 mcg by mouth Once a day  FLUoxetine (PROZAC) 10 mg Oral Capsule, Take 1 Capsule (10 mg total) by mouth Once a day  meloxicam (MOBIC) 7.5 mg Oral Tablet, TAKE 1 TABLET BY MOUTH EVERY DAY  methadone (DOLOPHINE) 5 mg/5 mL Oral Solution, Take 135 mg by mouth Once a day  promethazine (PHENERGAN) 25 mg Oral Tablet, Take 1 Tablet by mouth Every 4 hours as needed  valACYclovir (VALTREX) 500 mg Oral Tablet, Take 1 Tablet (500 mg total) by mouth Once a day    No facility-administered medications prior to visit.    Allergies:  Allergies   Allergen Reactions   . Shellfish Derived Rash   . Amoxicillin    . Iv Contrast Seizure   . Seafood [Shrimp]  Family Medical History:     Problem Relation (Age of Onset)    Cancer Sister    Depression Sister    Heart Attack Father, Maternal Grandfather    Hypertension (High Blood Pressure) Mother, Father    Liver Disease Sister          Social History     Tobacco Use   . Smoking status: Current Every Day Smoker     Packs/day: 0.50     Types: Cigarettes   . Smokeless tobacco: Never Used   . Tobacco comment: instructed not to smoke after midnight the night before surgery   Vaping Use   . Vaping Use: Some days   Substance Use Topics   . Alcohol use: Not Currently   . Drug use: Not Currently     Types: Methamphetamines, Marijuana, Opioid, IV     Comment: hx of herion use---clean x 167 days       Review of Systems  Review of Systems   Constitutional: Negative for fever.   Genitourinary: Negative for dysuria, genital sores, hematuria, non-menstrual bleeding and pelvic pain.       Nursing Notes:   Mike Gip, RN  04/09/20 1114  Signed  Pt is c/o small amount of bloody discharge vaginal d/c for the last month, pain, and a knot on her left labia.       Physical Exam:  Vitals:    04/09/20 1113   BP: 138/86   Pulse: 98   Resp: 16   Temp: 36.2 C (97.1 F)   SpO2: 98%   Weight: 130 kg (286 lb 3.2 oz)   Height: 1.626 m (5\' 4" )   BMI: 49.23      Physical  Exam  Genitourinary:     Labia:         Right: Tenderness present.         Left: Tenderness present.       Vagina: Vaginal discharge (white, copious), erythema and tenderness present. No bleeding.           Assessment/Plan:  Problem List Items Addressed This Visit    None     Visit Diagnoses     Vaginal discharge    -  Primary  Acute, Uncontrolled Problem  Suspect Trich  Start Flagyl  Labs pending -  No intercourse while on antibiotic  Reviewed med s/e profile    Relevant Medications    metroNIDAZOLE (FLAGYL) 500 mg Oral Tablet    Other Relevant Orders    URINALYSIS WITH REFLEX MICROSCOPIC AND CULTURE IF POSITIVE    NEISSERIA GONORRHOEAE RNA, NAAT    KOH PREP    [place]    Fissure of vulva      Acute, New Onset  No intercourse at this time  Sitz bath twice daily  Start topical estrogen to fissure  If no improvement will refer to GYN for consult.    Relevant Medications    conjugated estrogens (PREMARIN) 0.625 mg/gram Vaginal Cream          Tobacco cessation counseling performed.         Follow up: No follow-ups on file.  Seek medical attention for new or worsening symptoms.  Patient has been seen in this clinic within the last 3 years.     , APRN,FNP-BC      This note was partially created using MModal Fluency Direct system (voice recognition software) and is inherently subject to errors including those of syntax and "sound-alike" substitutions which may escape  proofreading.  In such instances, original meaning may be extrapolated by contextual derivation.

## 2020-04-12 ENCOUNTER — Other Ambulatory Visit (INDEPENDENT_AMBULATORY_CARE_PROVIDER_SITE_OTHER): Payer: Self-pay | Admitting: Family

## 2020-04-12 ENCOUNTER — Other Ambulatory Visit: Payer: MEDICAID | Attending: Family | Admitting: Family

## 2020-04-12 ENCOUNTER — Other Ambulatory Visit (INDEPENDENT_AMBULATORY_CARE_PROVIDER_SITE_OTHER): Payer: MEDICAID

## 2020-04-12 ENCOUNTER — Encounter (INDEPENDENT_AMBULATORY_CARE_PROVIDER_SITE_OTHER): Payer: Self-pay | Admitting: Family

## 2020-04-12 ENCOUNTER — Other Ambulatory Visit: Payer: Self-pay

## 2020-04-12 DIAGNOSIS — N898 Other specified noninflammatory disorders of vagina: Secondary | ICD-10-CM

## 2020-04-12 DIAGNOSIS — N9089 Other specified noninflammatory disorders of vulva and perineum: Secondary | ICD-10-CM

## 2020-04-12 DIAGNOSIS — N3000 Acute cystitis without hematuria: Secondary | ICD-10-CM

## 2020-04-12 LAB — URINE CULTURE,ROUTINE: URINE CULTURE: 50000 — AB

## 2020-04-12 LAB — HEPATITIS C VIRUS (HCV) RNA DETECTION AND QUANTIFICATION, PCR, PLASMA: HCV QUANTITATIVE PCR: NOT DETECTED

## 2020-04-12 MED ORDER — FLUCONAZOLE 150 MG TABLET
150.0000 mg | ORAL_TABLET | Freq: Once | ORAL | 1 refills | Status: AC
Start: 2020-04-12 — End: 2020-04-12

## 2020-04-12 MED ORDER — CLINDAMYCIN HCL 300 MG CAPSULE
300.0000 mg | ORAL_CAPSULE | Freq: Three times a day (TID) | ORAL | 0 refills | Status: AC
Start: 2020-04-12 — End: 2020-04-19

## 2020-04-13 LAB — NEISSERIA GONORRHOEAE RNA, NAAT: NEISSERIA GONORRHEA GC RNA: NEGATIVE

## 2020-04-19 ENCOUNTER — Other Ambulatory Visit (INDEPENDENT_AMBULATORY_CARE_PROVIDER_SITE_OTHER): Payer: Self-pay | Admitting: Family

## 2020-04-19 DIAGNOSIS — N9089 Other specified noninflammatory disorders of vulva and perineum: Secondary | ICD-10-CM

## 2020-04-19 NOTE — Telephone Encounter (Signed)
last filled 04/09/20

## 2020-04-23 ENCOUNTER — Encounter (INDEPENDENT_AMBULATORY_CARE_PROVIDER_SITE_OTHER): Payer: MEDICAID | Admitting: Family

## 2020-04-26 ENCOUNTER — Encounter (INDEPENDENT_AMBULATORY_CARE_PROVIDER_SITE_OTHER): Payer: MEDICAID | Admitting: Family

## 2020-05-13 ENCOUNTER — Encounter (INDEPENDENT_AMBULATORY_CARE_PROVIDER_SITE_OTHER): Payer: MEDICAID | Admitting: Family

## 2020-05-31 ENCOUNTER — Encounter (HOSPITAL_COMMUNITY): Payer: Self-pay

## 2020-05-31 ENCOUNTER — Emergency Department (HOSPITAL_COMMUNITY): Payer: MEDICAID

## 2020-05-31 ENCOUNTER — Other Ambulatory Visit: Payer: Self-pay

## 2020-05-31 ENCOUNTER — Emergency Department
Admission: EM | Admit: 2020-05-31 | Discharge: 2020-05-31 | Disposition: A | Discharge status: Home / Self Care | Payer: MEDICAID | Attending: EMERGENCY MEDICINE | Admitting: EMERGENCY MEDICINE

## 2020-05-31 DIAGNOSIS — F17290 Nicotine dependence, other tobacco product, uncomplicated: Secondary | ICD-10-CM | POA: Insufficient documentation

## 2020-05-31 DIAGNOSIS — B1920 Unspecified viral hepatitis C without hepatic coma: Secondary | ICD-10-CM | POA: Insufficient documentation

## 2020-05-31 DIAGNOSIS — F419 Anxiety disorder, unspecified: Secondary | ICD-10-CM | POA: Insufficient documentation

## 2020-05-31 DIAGNOSIS — K5010 Crohn's disease of large intestine without complications: Secondary | ICD-10-CM | POA: Insufficient documentation

## 2020-05-31 DIAGNOSIS — Z79899 Other long term (current) drug therapy: Secondary | ICD-10-CM | POA: Insufficient documentation

## 2020-05-31 DIAGNOSIS — M545 Low back pain, unspecified: Secondary | ICD-10-CM | POA: Insufficient documentation

## 2020-05-31 DIAGNOSIS — F17210 Nicotine dependence, cigarettes, uncomplicated: Secondary | ICD-10-CM | POA: Insufficient documentation

## 2020-05-31 DIAGNOSIS — Z6841 Body Mass Index (BMI) 40.0 and over, adult: Secondary | ICD-10-CM

## 2020-05-31 LAB — URINALYSIS, MACROSCOPIC
BILIRUBIN: NOT DETECTED mg/dL
BLOOD: NOT DETECTED mg/dL
GLUCOSE: NOT DETECTED mg/dL
KETONES: NOT DETECTED mg/dL
NITRITE: NOT DETECTED
PH: 6 (ref 5.0–?)
PROTEIN: NOT DETECTED mg/dL
SPECIFIC GRAVITY: 1.01 (ref 1.005–?)
UROBILINOGEN: NOT DETECTED mg/dL

## 2020-05-31 LAB — BASIC METABOLIC PANEL
ANION GAP: 8 mmol/L (ref 4–13)
BUN/CREA RATIO: 21 (ref 6–22)
BUN: 17 mg/dL (ref 8–25)
CALCIUM: 9.3 mg/dL (ref 8.5–10.0)
CHLORIDE: 106 mmol/L (ref 96–111)
CO2 TOTAL: 24 mmol/L (ref 22–30)
CREATININE: 0.81 mg/dL (ref 0.60–1.05)
ESTIMATED GFR: >90 mL/min/BSA (ref >=60)
GLUCOSE: 93 mg/dL (ref 65–125)
POTASSIUM: 4.4 mmol/L (ref 3.5–5.1)
SODIUM: 138 mmol/L (ref 136–145)

## 2020-05-31 LAB — CBC WITH DIFF
BASOPHIL #: <0.1 10*3/uL (ref <=0.20)
BASOPHIL %: 1 %
EOSINOPHIL #: 0.33 10*3/uL (ref <=0.50)
EOSINOPHIL %: 3 %
HCT: 43.9 % (ref 34.8–46.0)
HGB: 14.3 g/dL (ref 11.5–16.0)
IMMATURE GRANULOCYTE #: <0.1 10*3/uL (ref <0.10)
IMMATURE GRANULOCYTE %: 0 % (ref 0–1)
LYMPHOCYTE #: 2.63 10*3/uL (ref 1.00–4.80)
LYMPHOCYTE %: 25 %
MCH: 30.3 pg (ref 26.0–32.0)
MCHC: 32.6 g/dL (ref 31.0–35.5)
MCV: 93 fL (ref 78.0–100.0)
MONOCYTE #: 0.88 10*3/uL (ref 0.20–1.10)
MONOCYTE %: 8 %
MPV: 9.4 fL (ref 8.7–12.5)
NEUTROPHIL #: 6.54 10*3/uL (ref 1.50–7.70)
NEUTROPHIL %: 63 %
PLATELETS: 336 10*3/uL (ref 150–400)
RBC: 4.72 10*6/uL (ref 3.85–5.22)
RDW-CV: 12.9 % (ref 11.5–15.5)
WBC: 10.5 10*3/uL (ref 3.7–11.0)

## 2020-05-31 LAB — URINALYSIS, MICROSCOPIC

## 2020-05-31 MED ORDER — DEXAMETHASONE SODIUM PHOSPHATE 4 MG/ML INJECTION SOLUTION
10.0000 mg | INTRAMUSCULAR | Status: AC
Start: 2020-05-31 — End: 2020-05-31
  Administered 2020-05-31: 10 mg via INTRAVENOUS
  Filled 2020-05-31: qty 5

## 2020-05-31 MED ORDER — CYCLOBENZAPRINE 10 MG TABLET
10.0000 mg | ORAL_TABLET | Freq: Three times a day (TID) | ORAL | 0 refills | Status: AC
Start: 2020-05-31 — End: 2020-06-07

## 2020-05-31 MED ORDER — HYDROMORPHONE 1 MG/ML INJECTION WRAPPER
1.0000 mg | INJECTION | INTRAMUSCULAR | Status: AC
Start: 2020-05-31 — End: 2020-05-31
  Administered 2020-05-31: 1 mg via INTRAVENOUS
  Filled 2020-05-31: qty 1

## 2020-05-31 MED ORDER — HYDROCODONE 5 MG-ACETAMINOPHEN 325 MG TABLET
2.0000 | ORAL_TABLET | Freq: Four times a day (QID) | ORAL | 0 refills | Status: DC | PRN
Start: 2020-05-31 — End: 2020-09-24

## 2020-05-31 MED ORDER — PREDNISONE 10 MG TABLET
ORAL_TABLET | ORAL | 0 refills | Status: DC
Start: 2020-05-31 — End: 2020-09-24

## 2020-05-31 MED ORDER — DIAZEPAM 5 MG/ML INJECTION SYRINGE
2.5000 mg | INJECTION | INTRAMUSCULAR | Status: AC
Start: 2020-05-31 — End: 2020-05-31
  Administered 2020-05-31: 2.5 mg via INTRAVENOUS
  Filled 2020-05-31: qty 2

## 2020-05-31 NOTE — ED Triage Notes (Addendum)
LAST NIGHT SETTING LAUNDRY BASKET DOWN. WHEN STOOD UP. FELT A POP AND SHARP EXCRUCIATING PAIN ON THE LEFT SIDE AND COULD BARELY STAND UP. CAN NOT LIFT LEFT LEG AND IT FEELS LIKE IT IS TINGLING AND NUMB. CAN BARELY BEND OVER. PAIN TO LEFT SIDE ABOVE LEFT HIP.

## 2020-05-31 NOTE — ED Provider Notes (Signed)
Emergency Department  Provider Note  HPI - 05/31/2020      Name: Amanda Levine  Age and Gender: 35 y.o. female  Attending: Dr. Dorris Fetch    PCP: Raoul Pitch, APRN,FNP-BC    History provided by: Patient    HPI:  Amanda Levine is a 35 y.o. female  who presents to the ED today for L lower back pain. Pt endorses that last night at approximately 2200 she was sitting down a basket of laundry when she heard a "popping" noise in her L lower back. She relays that immediately after hearing the "pop" she was unable to bear weight due to pain. Pt states that a "sharp" sensation went "shooting" through her entire back. She verbalizes that she rested in bed and woke up with decreased back pain. Pt reports that she sat down to use the restroom this morning and heard another "popping" noise. She states that the severe pain returned and has been constant since this morning. Pt denies experiencing any bladder/bowel incontinence, urinary retention, or saddle paresthesia. According to the pt's chart, anxiety, hepatitis C, Crohn's colitis, and MRSA. Pt is an everyday tobacco smoker and vape user. No further associated symptoms or complaints are reported at this time.    Location: L lower back  Quality: "Sharp"  Onset: Last night 2200  Severity: Severe  Timing: Constant  Context: See HPI  Modifying factors: Pain is exacerbated with weight bearing. Rest with temporary relief.  Associated symptoms: + L lower back pain. - Bladder/bowel incontinence, urinary retention, saddle paresthesia.    Review of Systems:   Constitutional: No fever, chills, weakness.  Skin: No rashes, lesions.  HENT: No head injury. No sore throat, ear pain, difficulty swallowing.  Eyes: No vision changes, redness, discharge.  Cardio: No chest pain, palpitations.   Respiratory: No cough, wheezing, SOB.  GI: No abdominal pain. No nausea/vomiting. No diarrhea, constipation.   GU: No dysuria, hematuria, polyuria, urinary retention.  MSK: + L lower back  pain. No neck pain.  Neuro: No headache. No loss of sensation, focal deficits, LOC. No bladder/bowel incontinence, saddle paresthesia.  All other systems reviewed and are negative, unless commented on in the HPI.      The below information was reviewed with the patient:     Current Medications:  Current Outpatient Medications   Medication Sig   . buPROPion (WELLBUTRIN XL) 150 mg extended release 24 hr tablet Take 2 Tablets (300 mg total) by mouth Once a day for 180 days   . busPIRone (BUSPAR) 15 mg Oral Tablet TAKE 1 TABLET (15 MG TOTAL) BY MOUTH THREE TIMES A DAY FOR 30 DAYS   . cyclobenzaprine (FLEXERIL) 10 mg Oral Tablet Take 1 Tablet (10 mg total) by mouth Three times a day for 7 days   . FLUoxetine (PROZAC) 10 mg Oral Capsule Take 1 Capsule (10 mg total) by mouth Once a day   . HYDROcodone-acetaminophen (NORCO) 5-325 mg Oral Tablet Take 2 Tablets by mouth Every 6 hours as needed for Pain   . meloxicam (MOBIC) 7.5 mg Oral Tablet TAKE 1 TABLET BY MOUTH EVERY DAY   . methadone (DOLOPHINE) 5 mg/5 mL Oral Solution Take 135 mg by mouth Once a day   . predniSONE (DELTASONE) 10 mg Oral Tablet 4 TABLETS QAM X 3 DAYS, THEN 3 TABLETS QAM X 3 DAYS, THEN 2 TABLETS QAM X 3 DAYS, THEN 1 TABLET QAM X 3 DAYS   . promethazine (PHENERGAN) 25 mg Oral Tablet Take 1  Tablet by mouth Every 4 hours as needed   . valACYclovir (VALTREX) 500 mg Oral Tablet Take 1 Tablet (500 mg total) by mouth Once a day       Allergies:   Allergies   Allergen Reactions   . Shellfish Derived Rash   . Amoxicillin    . Iv Contrast Seizure   . Seafood [Shrimp]        Past Medical History:  Past Medical History:   Diagnosis Date   . Anxiety    . Convulsions (CMS HCC)     with IV contrast x 1   . Crohn's colitis (CMS HCC)    . Depression    . Ectopic pregnancy    . Elevated liver enzymes    . Genital herpes    . Gilbert's disease    . H/O urinary tract infection     1 month ago   . History of intravenous drug use in remission    . HPV in female     Patient  states she has this   . Insomnia    . Oral herpes    . Sciatica    . Viral hepatitis    . Viral hepatitis C     RESOLVED       Past Surgical History:  Past Surgical History:   Procedure Laterality Date   . Colonoscopy  2016   . Dilation and curettage, diagnostic / therapeutic     . Ectopic pregnancy surgery     . Hx cesarean section     . Hx tonsillectomy     . Hx tubal ligation     . Knee surgery Left 07/09/2019   . Liver biopsy  05/2019       Social History:  Social History     Tobacco Use   . Smoking status: Current Every Day Smoker     Packs/day: 0.25     Types: Cigarettes   . Smokeless tobacco: Never Used   . Tobacco comment: instructed not to smoke after midnight the night before surgery   Vaping Use   . Vaping Use: Some days   Substance Use Topics   . Alcohol use: Not Currently   . Drug use: Not Currently     Types: Methamphetamines, Marijuana, Opioid, IV     Comment: hx of herion use---clean x 167 days     Social History     Substance and Sexual Activity   Drug Use Not Currently   . Types: Methamphetamines, Marijuana, Opioid, IV    Comment: hx of herion use---clean x 167 days       Family History:  Family History   Problem Relation Age of Onset   . Hypertension (High Blood Pressure) Mother    . Heart Attack Father    . Hypertension (High Blood Pressure) Father    . Depression Sister    . Cancer Sister    . Liver Disease Sister    . Heart Attack Maternal Grandfather        Old records were reviewed.    Objective:  Nursing notes were reviewed.    Filed Vitals:    05/31/20 0900 05/31/20 0915 05/31/20 0930 05/31/20 0938   BP: (!) 152/76 (!) 159/86 (!) 145/82    Pulse:       Resp:       Temp:    37.2 C (99 F)   SpO2: 95% 96% 96%        Physical Exam:  Nursing  note and vitals reviewed.  Vital signs reviewed as above.     Constitutional: Pt is well-developed and well-nourished.   Head: Normocephalic and atraumatic.   Eyes: Conjunctivae are normal. Pupils are equal, round, and reactive to light. EOM are  intact.  Neck: Soft, supple, full range of motion.  Cardiovascular: RRR. No Murmurs/rubs/gallops. Distal pulses present and equal bilaterally.  Pulmonary/Chest: Normal BS BL with no distress. No audible wheezes or crackles are noted.  GI: Abdomen is soft, nontender, nondistended. No rebound, guarding, or masses.  Back: + Palpable muscle spasm to the L paralumbar area. Normal range of motion.   Extremities: + Reproducible LLE pain with flexion. Normal range of motion. No deformities. Exhibits no edema.  Skin: Warm and dry. No rash or lesions.  Neurological: Alert and oriented X 3. Neurologically intact. No focal deficits noted. Normal sensation and muscle strength to the LLE. Normal LLE flexion.  Psychiatric: Patient has a normal mood and affect.     Plan:   Appropriate labs and/or imaging ordered. Medical Records reviewed.    Work-up:  Orders Placed This Encounter   . CT LUMBAR SPINE WO IV CONTRAST   . CBC/DIFF   . BASIC METABOLIC PANEL   . URINALYSIS, MACROSCOPIC   . CBC WITH DIFF   . URINALYSIS, MICROSCOPIC   . dexamethasone 4 mg/mL injection   . HYDROmorphone (DILAUDID) 1 mg/mL injection   . diazePAM (VALIUM) 5 mg/mL injection   . predniSONE (DELTASONE) 10 mg Oral Tablet   . HYDROcodone-acetaminophen (NORCO) 5-325 mg Oral Tablet   . cyclobenzaprine (FLEXERIL) 10 mg Oral Tablet        Labs:  Results for orders placed or performed during the hospital encounter of 05/31/20 (from the past 24 hour(s))   CBC/DIFF    Narrative    The following orders were created for panel order CBC/DIFF.  Procedure                               Abnormality         Status                     ---------                               -----------         ------                     CBC WITH [id number]                                    Final result                 Please view results for these tests on the individual orders.   BASIC METABOLIC PANEL   Result Value Ref Range    SODIUM 138 136 - 145 mmol/L    POTASSIUM 4.4 3.5 - 5.1 mmol/L     CHLORIDE 106 96 - 111 mmol/L    CO2 TOTAL 24 22 - 30 mmol/L    ANION GAP 8 4 - 13 mmol/L    CALCIUM 9.3 8.5 - 10.0 mg/dL    GLUCOSE 93 65 - 474125 mg/dL    BUN 17 8 - 25 mg/dL    CREATININE 2.590.81 5.630.60 -  1.05 mg/dL    BUN/CREA RATIO 21 6 - 22    ESTIMATED GFR >90 >=60 mL/min/BSA   URINALYSIS, MACROSCOPIC   Result Value Ref Range    COLOR Yellow Colorless, Straw, Yellow    APPEARANCE Clear Clear    SPECIFIC GRAVITY 1.010 >1.005 - <1.030    PH 6.0 >5.0 - <8.0    PROTEIN Not Detected Not Detected mg/dL    GLUCOSE Not Detected Not Detected mg/dL    KETONES Not Detected Not Detected mg/dL    UROBILINOGEN Not Detected Not Detected mg/dL    BILIRUBIN Not Detected Not Detected mg/dL    BLOOD Not Detected Not Detected mg/dL    NITRITE Not Detected Not Detected    LEUKOCYTES Trace (A) Not Detected WBCs/uL   CBC WITH DIFF   Result Value Ref Range    WBC 10.5 3.7 - 11.0 x10^3/uL    RBC 4.72 3.85 - 5.22 x10^6/uL    HGB 14.3 11.5 - 16.0 g/dL    HCT 45.8 09.9 - 83.3 %    MCV 93.0 78.0 - 100.0 fL    MCH 30.3 26.0 - 32.0 pg    MCHC 32.6 31.0 - 35.5 g/dL    RDW-CV 82.5 05.3 - 97.6 %    PLATELETS 336 150 - 400 x10^3/uL    MPV 9.4 8.7 - 12.5 fL    NEUTROPHIL % 63 %    LYMPHOCYTE % 25 %    MONOCYTE % 8 %    EOSINOPHIL % 3 %    BASOPHIL % 1 %    NEUTROPHIL # 6.54 1.50 - 7.70 x10^3/uL    LYMPHOCYTE # 2.63 1.00 - 4.80 x10^3/uL    MONOCYTE # 0.88 0.20 - 1.10 x10^3/uL    EOSINOPHIL # 0.33 <=0.50 x10^3/uL    BASOPHIL # <0.10 <=0.20 x10^3/uL    IMMATURE GRANULOCYTE % 0 0 - 1 %    IMMATURE GRANULOCYTE # <0.10 <0.10 x10^3/uL   URINALYSIS, MICROSCOPIC   Result Value Ref Range    WBCS 0-5 0-5, None /hpf    RBCS 0-5 None, 0-5 /hpf    SQUAMOUS EPITHELIAL Rare (A) None /hpf    TRANSITIONAL EPITHELIAL Rare (A) None /hpf       Abnormal Lab results:  Labs Reviewed   URINALYSIS, MACROSCOPIC - Abnormal; Notable for the following components:       Result Value    LEUKOCYTES Trace (*)     All other components within normal limits   URINALYSIS, MICROSCOPIC -  Abnormal; Notable for the following components:    SQUAMOUS EPITHELIAL Rare (*)     TRANSITIONAL EPITHELIAL Rare (*)     All other components within normal limits   BASIC METABOLIC PANEL - Normal   CBC/DIFF    Narrative:     The following orders were created for panel order CBC/DIFF.  Procedure                               Abnormality         Status                     ---------                               -----------         ------  CBC WITH [id number][[id number]]                                    Final result                 Please view results for these tests on the individual orders.   CBC WITH DIFF       Imaging:   Results for orders placed or performed during the hospital encounter of 05/31/20 (from the past 72 hour(s))   CT LUMBAR SPINE WO IV CONTRAST     Status: None    Narrative    Female, 35 years old.    CT LUMBAR SPINE WO IV CONTRAST performed on 05/31/2020 8:51 AM.    REASON FOR EXAM:  left leg pain after bending over  RADIATION DOSE: 1291 DLP    TECHNIQUE: Volumetric acquisition of the lumbar spine. This CT scanner is equipped with dose reducing technology. The exposure is automatically adjusted according to patient body size in order to deliver the lowest dose possible.    COMPARISON: None available.    FINDINGS: Preserved lumbar lordosis. No acute fracture or traumatic malalignment. Intervertebral disc space heights are maintained. No definite high-grade spinal canal or neural foraminal stenosis. Included lung bases are well-aerated.      Impression    No acute fracture or traumatic malalignment.            Radiologist location ID: [id number]         MDM:    During the patient's stay in the emergency department, the above listed imaging and/or labs were performed to assist with medical decision making and were reviewed by myself as available for review.    Results discussed with patient.    Patient rechecked and remained stable throughout the emergency department course.   All  questions/concerns addressed, and patient agrees with disposition plan.        Impression:   Diagnosis     Diagnosis Comment Added By Time Added    Low back pain  Westley Hummer, MD 05/31/2020  9:18 AM        Disposition:   Discharged   Discussed with patient all lab and/or imaging results, diagnosis, treatment, and need for follow up.    Medication instructions were discussed with the patient.   It was advised that the patient return to the ED with any new, concerning, or worsening symptoms and follow up as directed.    The patient verbalized understanding of all instructions and had no further questions or concerns.     Follow up:   Hansel Feinstein, MD  [address]  [address]  [place] [address]  [phone]    In 2 weeks      Prescriptions:  Discharge Medication List as of 05/31/2020  9:19 AM      START taking these medications    Details   cyclobenzaprine (FLEXERIL) 10 mg Oral Tablet Take 1 Tablet (10 mg total) by mouth Three times a day for 7 days, Disp-21 Tablet, R-0, Print      HYDROcodone-acetaminophen (NORCO) 5-325 mg Oral Tablet Take 2 Tablets by mouth Every 6 hours as needed for Pain, Disp-20 Tablet, R-0, Print      predniSONE (DELTASONE) 10 mg Oral Tablet 4 TABLETS QAM X 3 DAYS, THEN 3 TABLETS QAM X 3 DAYS, THEN 2 TABLETS QAM X 3 DAYS, THEN 1 TABLET QAM X  3 DAYS, Disp-30 Tablet, R-0, Print           A comprehensive pain management plan for the patient was developed and discussed with the patient. The patient's previous experience with non-opioid and opioid medications was reviewed. The patient's experience with non-pharmacological/alternative therapies for pain management was reviewed. The patient's history in regard to substance abuse was reviewed. Information from the Controlled Substance Monitoring Program was accessed.   Alternative treatments, non-pharmacological pain management options, were prescribed as a part of the comprehensive the pain management treatment plan.  The risks, benefits,  and availability of these alternative treatment options were reviewed.  Non-opioid, pharmacological pain management options, were prescribed, and the risks and benefits of these options were reviewed.  Opioid pain management options were discussed, including the risks and benefits of these options. Specifically, the patient was advised that opioids are highly addictive, even when taken as prescribed, that there is a risk of developing a physical or psychological dependence on the controlled substance, and that the risks of taking more opioids than prescribed, or mixing sedatives, benzodiazepines, or alcohol with opioids, can result in fatal respiratory depression.  It was determined that an opioid prescription medication is a necessary and appropriate part of the patient's comprehensive pain management plan.    The patient was advised they would be receiving a prescription for Norco (20 tablets) at discharge.     The patient was advised they could fill the prescription for a lesser amount if they chose. They were advised if they did choose to partially fill the prescription they might not be able to get an addition opioid prescription for several days if they ran out of medication.  The patient and/or their guardian were given an opportunity to ask questions about the comprehensive pain management plan and their questions were answered.      I am scribing for, and in the presence of, Dr. Dorris Fetch for services provided on 05/31/2020.  Damaris Hippo, SCRIBE   [place], [place]  05/31/2020, 07:37      I personally performed the services described in this documentation, as scribed  in my presence, and it is both accurate  and complete.    Westley Hummer, MD  Westley Hummer, MD  06/01/2020, 08:15

## 2020-05-31 NOTE — ED Nurses Note (Signed)
Pt verbalizes understanding of discharge instructions and 3 RX. Pt wheeled out of ER in wheelchair by this nurse.

## 2020-05-31 NOTE — ED Nurses Note (Signed)
Pt urine collected and sent to lab at [facility]

## 2020-05-31 NOTE — ED Nurses Note (Signed)
Pt unhooked from monitor and taken to CT at [phone]

## 2020-06-01 ENCOUNTER — Telehealth (HOSPITAL_COMMUNITY): Payer: Self-pay | Admitting: Family

## 2020-06-01 ENCOUNTER — Encounter (HOSPITAL_COMMUNITY): Payer: Self-pay

## 2020-06-01 NOTE — Telephone Encounter (Signed)
Post Ed Follow-Up    Post ED Follow-Up:   Document completed and/or attempted interactive contact(s) after transition to home after emergency department stay.:   Transition Facility and relevant Date:   Discharge Date: 05/31/20  Discharge from [facility] Emergency Department?: Yes  Discharge Facility: [facility]  Contacted by: Glorianne Manchester, RN   Contact method: MyChart Patient Portal, Patient/Caregiver Telephone  Contact first attempt: 06/01/2020  2:04 PM  Interventions: no answer-message left

## 2020-06-08 ENCOUNTER — Encounter (INDEPENDENT_AMBULATORY_CARE_PROVIDER_SITE_OTHER): Payer: Self-pay | Admitting: Family

## 2020-06-08 ENCOUNTER — Other Ambulatory Visit (INDEPENDENT_AMBULATORY_CARE_PROVIDER_SITE_OTHER): Payer: Self-pay | Admitting: Family

## 2020-06-08 DIAGNOSIS — Z8619 Personal history of other infectious and parasitic diseases: Secondary | ICD-10-CM

## 2020-06-08 DIAGNOSIS — F419 Anxiety disorder, unspecified: Secondary | ICD-10-CM

## 2020-06-08 DIAGNOSIS — F32A Depression, unspecified: Secondary | ICD-10-CM

## 2020-06-08 MED ORDER — VALACYCLOVIR 500 MG TABLET
500.0000 mg | ORAL_TABLET | Freq: Every day | ORAL | 3 refills | Status: DC
Start: 2020-06-08 — End: 2020-09-16

## 2020-06-08 MED ORDER — BUPROPION HCL XL 150 MG 24 HR TABLET, EXTENDED RELEASE
300.0000 mg | ORAL_TABLET | Freq: Every day | ORAL | 1 refills | Status: DC
Start: 2020-06-08 — End: 2020-09-24

## 2020-06-08 NOTE — Telephone Encounter (Signed)
-----   Message from Hilma L. Belter sent at 06/08/2020  3:42 PM EDT -----  Regarding: Refills  I need refills on my valacyclovir my bottle says I have 40 more but [facility] said that my insurance wants the doc to have to call them in an also my welbutrin.

## 2020-06-10 ENCOUNTER — Telehealth (INDEPENDENT_AMBULATORY_CARE_PROVIDER_SITE_OTHER): Payer: Self-pay | Admitting: Family

## 2020-06-10 DIAGNOSIS — G4700 Insomnia, unspecified: Secondary | ICD-10-CM

## 2020-06-10 MED ORDER — TRAZODONE 50 MG TABLET
50.0000 mg | ORAL_TABLET | Freq: Every evening | ORAL | 0 refills | Status: DC
Start: 2020-06-10 — End: 2020-08-26

## 2020-06-10 NOTE — Telephone Encounter (Signed)
-----   Message from Anyela L. Dimock sent at 06/10/2020  4:28 PM EDT -----  Regarding: Refills  For both meds. An also I wanted to ask about if doctor jess could give me something to help me sleep .. I have tryed melatonin an the zzz's gel thing tylenol p.ms my buspars dont make me sleepy. When I went to the [facility] a while ago they had me on seroquel an minipress for night terrors I lay in bed an toss an turn..I'm lucky to get 4 hrs a sleep a night..I work at [employer] as a [employer] 10-14hr shifts when I'm home I need to sleep an I cant. It's like I cant unwind no matter how exhausted I am..I'm just restless an irritable

## 2020-06-18 ENCOUNTER — Other Ambulatory Visit (INDEPENDENT_AMBULATORY_CARE_PROVIDER_SITE_OTHER): Payer: Self-pay | Admitting: Family

## 2020-06-18 DIAGNOSIS — G4700 Insomnia, unspecified: Secondary | ICD-10-CM

## 2020-06-18 NOTE — Telephone Encounter (Deleted)
Last scheduled appointment with you was 04/09/2020, Currently scheduled future appointment is Visit date not found,     Patient has been seen within the last year: Yes.    Confirmed preferred pharmacy for this refill encounter is   Preferred Pharmacy     [facility] [phone] [name]    [address] [place] [address]    Phone: [phone] Fax: [phone]    Hours: Not open 24 hours      .     [name], MA  06/18/2020, 09:00

## 2020-07-12 ENCOUNTER — Ambulatory Visit: Payer: MEDICAID | Attending: INFECTIOUS DISEASE

## 2020-07-12 ENCOUNTER — Other Ambulatory Visit: Payer: Self-pay

## 2020-07-12 DIAGNOSIS — B182 Chronic viral hepatitis C: Secondary | ICD-10-CM | POA: Insufficient documentation

## 2020-07-12 LAB — CBC WITH DIFF
BASOPHIL #: <0.1 10*3/uL (ref <=0.20)
BASOPHIL %: 0 %
EOSINOPHIL #: 0.27 10*3/uL (ref <=0.50)
EOSINOPHIL %: 3 %
HCT: 44.2 % (ref 34.8–46.0)
HGB: 14.4 g/dL (ref 11.5–16.0)
IMMATURE GRANULOCYTE #: <0.1 10*3/uL (ref <0.10)
IMMATURE GRANULOCYTE %: 0 % (ref 0–1)
LYMPHOCYTE #: 2.84 10*3/uL (ref 1.00–4.80)
LYMPHOCYTE %: 31 %
MCH: 30.2 pg (ref 26.0–32.0)
MCHC: 32.6 g/dL (ref 31.0–35.5)
MCV: 92.7 fL (ref 78.0–100.0)
MONOCYTE #: 0.85 10*3/uL (ref 0.20–1.10)
MONOCYTE %: 9 %
MPV: 9.9 fL (ref 8.7–12.5)
NEUTROPHIL #: 5.17 10*3/uL (ref 1.50–7.70)
NEUTROPHIL %: 57 %
PLATELETS: 314 10*3/uL (ref 150–400)
RBC: 4.77 10*6/uL (ref 3.85–5.22)
RDW-CV: 13.1 % (ref 11.5–15.5)
WBC: 9.2 10*3/uL (ref 3.7–11.0)

## 2020-07-12 LAB — COMPREHENSIVE METABOLIC PANEL, NON-FASTING
ALBUMIN: 3.9 g/dL (ref 3.5–5.0)
ALKALINE PHOSPHATASE: 60 U/L (ref 40–110)
ALT (SGPT): 9 U/L (ref 8–22)
ANION GAP: 7 mmol/L (ref 4–13)
AST (SGOT): 16 U/L (ref 8–45)
BILIRUBIN TOTAL: 0.3 mg/dL (ref 0.3–1.3)
BUN/CREA RATIO: 16 (ref 6–22)
BUN: 14 mg/dL (ref 8–25)
CALCIUM: 9.9 mg/dL (ref 8.5–10.0)
CHLORIDE: 105 mmol/L (ref 96–111)
CO2 TOTAL: 25 mmol/L (ref 22–30)
CREATININE: 0.85 mg/dL (ref 0.60–1.05)
ESTIMATED GFR: >90 mL/min/BSA (ref >=60)
GLUCOSE: 80 mg/dL (ref 65–125)
POTASSIUM: 4.4 mmol/L (ref 3.5–5.1)
PROTEIN TOTAL: 7 g/dL (ref 6.4–8.3)
SODIUM: 137 mmol/L (ref 136–145)

## 2020-07-14 LAB — HEPATITIS C VIRUS (HCV) RNA DETECTION AND QUANTIFICATION, PCR, PLASMA: HCV QUANTITATIVE PCR: NOT DETECTED

## 2020-07-26 ENCOUNTER — Other Ambulatory Visit (INDEPENDENT_AMBULATORY_CARE_PROVIDER_SITE_OTHER): Payer: Self-pay | Admitting: Family

## 2020-07-26 DIAGNOSIS — N9089 Other specified noninflammatory disorders of vulva and perineum: Secondary | ICD-10-CM

## 2020-07-27 NOTE — Telephone Encounter (Signed)
Last scheduled appointment with you was 04/09/2020, Currently scheduled future appointment is Visit date not found,     Patient has been seen within the last year: Yes.    Confirmed preferred pharmacy for this refill encounter is   Preferred Pharmacy     [id number]   [address]   Phone: [phone]: [phone]   Hours: Not open 24 hours      .     SProvidence Lanius LPN  72/37/9909 040:00

## 2020-08-03 ENCOUNTER — Other Ambulatory Visit (INDEPENDENT_AMBULATORY_CARE_PROVIDER_SITE_OTHER): Payer: Self-pay | Admitting: Family

## 2020-08-03 DIAGNOSIS — F419 Anxiety disorder, unspecified: Secondary | ICD-10-CM

## 2020-08-03 DIAGNOSIS — F32A Depression, unspecified: Secondary | ICD-10-CM

## 2020-08-03 NOTE — Nursing Note (Signed)
Last scheduled appointment with you was 04/09/2020, Currently scheduled future appointment is Visit date not found,     Patient has been seen within the last year: Yes.    Confirmed preferred pharmacy for this refill encounter is   Preferred Pharmacy     [facility] [phone] [name] [facility] [address]    [address] [place] [address]    Phone: [phone] Fax: [phone]    Hours: Not open 24 hours      .     [name], MA  08/03/2020, 10:28

## 2020-08-12 ENCOUNTER — Other Ambulatory Visit (INDEPENDENT_AMBULATORY_CARE_PROVIDER_SITE_OTHER): Payer: Self-pay | Admitting: Family

## 2020-08-12 DIAGNOSIS — G4700 Insomnia, unspecified: Secondary | ICD-10-CM

## 2020-08-12 DIAGNOSIS — M2392 Unspecified internal derangement of left knee: Secondary | ICD-10-CM

## 2020-08-12 DIAGNOSIS — M1712 Unilateral primary osteoarthritis, left knee: Secondary | ICD-10-CM

## 2020-08-12 NOTE — H&P (Deleted)
GASTROENTEROLOGY, MEDICAL OFFICE BUILDING B  [address]  [place] [address]  [phone]    Progress Note  Name: Amanda Levine  MRN: [id number]  DOB: [birth year]  Age: 35 y.o.  Date: 08/13/2020    Reason for Visit: No chief complaint on file.    History of Present Illness:  Amanda Levine is a 35 y.o. female who presents today for ***.    07/2020 HCV PCR-not detected, CBC/CMP-normal   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  05/2019 ANA- positive, CBC/AMA/ASMA/H.pylori/TTG-negative, ALT 316, AST 157   04/2019 Fibrotest: F0/A3, HCV PCR 866000, HAV IgG-negative, HBsAb-reactive, HBsAg-negative, AST 152, ALT 207  Liver ultrasound: tiny adherent stones or gallbladder polyps without signs of cholecystitis, moderate dilation of CBD to 24mm, recommend MRCP vs ERCP vs CT, normal liver appearance    Crohns/hep c- requet colonoscopy, stool studies, t/c pred/mesalamine nemas, cont Miralax, needs hep c cleared- following with Dr. Perrin Smack    Denies fevers, chills, nausea, vomiting, heartburn, reflux, melena, hematochezia, or unintentional weight loss.      Patient History:  Past Medical History:   Diagnosis Date   . Anxiety    . Convulsions (CMS HCC)     with IV contrast x 1   . Crohn's colitis (CMS HCC)    . Depression    . Ectopic pregnancy    . Elevated liver enzymes    . Genital herpes    . Gilbert's disease    . H/O urinary tract infection     1 month ago   . History of intravenous drug use in remission    . 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 Insomnia    . Oral herpes    . Sciatica    . Viral hepatitis    . Viral hepatitis C     RESOLVED         Past Surgical History:   Procedure Laterality Date   . COLONOSCOPY  2016    [place]   . DILATION AND CURETTAGE, DIAGNOSTIC / THERAPEUTIC     . ECTOPIC PREGNANCY SURGERY     . HX CESAREAN SECTION      x 2   . HX TONSILLECTOMY     . HX TUBAL LIGATION     . KNEE SURGERY Left 07/09/2019    knee arthroscopy with partial medial  meniscectomy and anterior cruciate ligament reconstruction bone to bone   . LIVER BIOPSY  05/2019         Current Outpatient Medications   Medication Sig   . buPROPion (WELLBUTRIN XL) 150 mg extended release 24 hr tablet Take 2 Tablets (300 mg total) by mouth Once a day for 180 days   . busPIRone (BUSPAR) 15 mg Oral Tablet TAKE 1 TABLET BY MOUTH THREE TIMES A DAY   . FLUoxetine (PROZAC) 10 mg Oral Capsule Take 1 Capsule (10 mg total) by mouth Once a day   . HYDROcodone-acetaminophen (NORCO) 5-325 mg Oral Tablet Take 2 Tablets by mouth Every 6 hours as needed for Pain   . meloxicam (MOBIC) 7.5 mg Oral Tablet TAKE 1 TABLET BY MOUTH EVERY DAY   . methadone (DOLOPHINE) 5 mg/5 mL Oral Solution Take 135 mg by mouth Once a day   . predniSONE (DELTASONE) 10 mg Oral Tablet 4 TABLETS QAM X 3 DAYS, THEN 3 TABLETS QAM X 3 DAYS, THEN 2 TABLETS QAM X 3 DAYS, THEN 1 TABLET QAM X  3 DAYS   . PREMARIN 0.625 mg/gram Vaginal Cream INSERT 1/2 GRAM VAGINALLY ONCE NIGHTLY   . promethazine (PHENERGAN) 25 mg Oral Tablet Take 1 Tablet by mouth Every 4 hours as needed   . traZODone (DESYREL) 50 mg Oral Tablet Take 1 Tablet (50 mg total) by mouth Every night for 90 days   . valACYclovir (VALTREX) 500 mg Oral Tablet Take 1 Tablet (500 mg total) by mouth Once a day     Allergies   Allergen Reactions   . Shellfish Derived Rash   . Amoxicillin    . Iv Contrast Seizure   . Seafood [Shrimp]      Family Medical History:     Problem Relation (Age of Onset)    Cancer Sister    Depression Sister    Heart Attack Father, Maternal Grandfather    Hypertension (High Blood Pressure) Mother, Father    Liver Disease Sister          Social History     Socioeconomic History   . Marital status: Legally Separated     Spouse name: Not on file   . Number of children: Not on file   . Years of education: Not on file   . Highest education level: Not on file   Occupational History   . Not on file   Tobacco Use   . Smoking status: Current Every Day Smoker     Packs/day:  0.25     Types: Cigarettes   . Smokeless tobacco: Never Used   . Tobacco comment: instructed not to smoke after midnight the night before surgery   Vaping Use   . Vaping Use: Some days   Substance and Sexual Activity   . Alcohol use: Not Currently   . Drug use: Not Currently     Types: Methamphetamines, Marijuana, Opioid, IV     Comment: hx of herion use---clean x 167 days   . Sexual activity: Yes     Partners: Male     Birth control/protection: Female Sterilization   Other Topics Concern   . Ability to Walk 1 Flight of Steps without SOB/CP No     Comment: "a little winded"   . Routine Exercise Not Asked   . Ability to Walk 2 Flight of Steps without SOB/CP Not Asked   . Unable to Ambulate Not Asked   . Total Care Not Asked   . Ability To Do Own ADL's Yes   . Uses Walker Not Asked   . Other Activity Level Not Asked   . Uses Cane Not Asked   Social History Narrative   . Not on file     Social Determinants of Health     Financial Resource Strain: Not on file   Food Insecurity: Not on file   Transportation Needs: Not on file   Physical Activity: Not on file   Stress: Not on file   Intimate Partner Violence: Not on file   Housing Stability: Not on file     Review of Systems:                                      All other review of systems negative    Physical Exam:  There were no vitals taken for this visit.      Physical Exam  Constitutional:       General: She is not in acute distress.  Appearance: She is not diaphoretic.   HENT:      Head: Normocephalic.   Eyes:      Pupils: Pupils are equal, round, and reactive to light.   Cardiovascular:      Rate and Rhythm: Normal rate and regular rhythm.      Heart sounds: Normal heart sounds. No murmur heard.  Pulmonary:      Effort: Pulmonary effort is normal. No respiratory distress.      Breath sounds: Normal breath sounds. No wheezing.   Abdominal:      General: Bowel sounds are normal. There is no distension.      Palpations: Abdomen is soft. There is no mass.       Tenderness: There is no abdominal tenderness. There is no guarding or rebound.   Musculoskeletal:         General: Normal range of motion.   Skin:     General: Skin is warm and dry.      Findings: No erythema or rash.   Neurological:      Mental Status: She is alert and oriented to person, place, and time.   Psychiatric:         Judgment: Judgment normal.       Assessment and Plan:    Tama Headings, FNP-C  08/12/2020, 08:10    This note may have been partially generated using MModal Fluency Direct system, and there may be some incorrect words, spellings, and punctuation that were not noted in checking the note before saving, though effort was made to avoid such errors.

## 2020-08-12 NOTE — Telephone Encounter (Deleted)
Last scheduled appointment with you was 04/09/2020, Currently scheduled future appointment is Visit date not found,     Patient has been seen within the last year: Yes.    Confirmed preferred pharmacy for this refill encounter is   Preferred [facility]acy     [phone] [name] [facility] [address]    [address] [place] [address]    Phone: [phone] Fax: [phone]    Hours: Not open 24 hours      .     [name], MA  08/12/2020, 15:22

## 2020-08-13 ENCOUNTER — Ambulatory Visit (HOSPITAL_BASED_OUTPATIENT_CLINIC_OR_DEPARTMENT_OTHER): Payer: MEDICAID | Admitting: Nurse Practitioner

## 2020-08-26 ENCOUNTER — Other Ambulatory Visit (INDEPENDENT_AMBULATORY_CARE_PROVIDER_SITE_OTHER): Payer: Self-pay | Admitting: Family

## 2020-08-26 ENCOUNTER — Encounter (INDEPENDENT_AMBULATORY_CARE_PROVIDER_SITE_OTHER): Payer: Self-pay | Admitting: Family

## 2020-08-26 DIAGNOSIS — G4700 Insomnia, unspecified: Secondary | ICD-10-CM

## 2020-08-26 MED ORDER — TRAZODONE 50 MG TABLET
50.0000 mg | ORAL_TABLET | Freq: Every evening | ORAL | 0 refills | Status: DC
Start: 2020-08-26 — End: 2020-10-04

## 2020-08-26 NOTE — Telephone Encounter (Addendum)
Last scheduled appointment with you was 04/09/2020, Currently scheduled future appointment is Visit date not found,     Patient has been seen within the last year: Yes.    Confirmed preferred pharmacy for this refill encounter is   Preferred Pharmacy     [facility] [phone] [name] [facility] [address]    [address] [place] [address]    Phone: [phone] Fax: [phone]    Hours: Not open 24 hours      .     [name], MA  08/26/2020, 10:20        Confirmed preferred pharmacy for this refill encounter is   Preferred Pharmacy     [facility] [phone] [name] [facility] [address]    [address] [place] [address]    Phone: [phone] Fax: [phone]    Hours: Not open 24 hours      .     [name], MA  08/26/2020, 10:20              ----- Message from Jailey L. Blahnik sent at 08/26/2020  9:50 AM EDT -----  Regarding: Refill   Good morning. I don't have any more refills on my trazodon. So I was wondering if I could get a refill on those. Or try a med called rimron or something like that . It's a sleeping pill as well. I work at [employer] in the [employer] an I know we give them to clients to help them sleep. The trazodon didn't really work that well so I tryed to take some melatonin with them but then I just had restless legs.

## 2020-09-16 ENCOUNTER — Encounter (INDEPENDENT_AMBULATORY_CARE_PROVIDER_SITE_OTHER): Payer: Self-pay | Admitting: Family

## 2020-09-16 ENCOUNTER — Other Ambulatory Visit (INDEPENDENT_AMBULATORY_CARE_PROVIDER_SITE_OTHER): Payer: Self-pay | Admitting: Family

## 2020-09-16 DIAGNOSIS — Z8619 Personal history of other infectious and parasitic diseases: Secondary | ICD-10-CM

## 2020-09-16 MED ORDER — VALACYCLOVIR 500 MG TABLET
500.0000 mg | ORAL_TABLET | Freq: Every day | ORAL | 3 refills | Status: DC
Start: 2020-09-16 — End: 2020-12-30

## 2020-09-16 NOTE — Telephone Encounter (Signed)
-----   Message from Micayla L. Devaul sent at 09/16/2020  3:16 PM EDT -----  Regarding: Refill   Good afternoon I was wondering if I could get [facility] [address] [facility] the valacyclovir my bottle says I still have 39 refills but my insurance is [employer] education [employer] and the pharmacy told me that it has something to do with my insurance that I've reached the maximum amount of times it can be refilled so I have to call my provider my doctor to call me in another prescription for them

## 2020-09-16 NOTE — Telephone Encounter (Signed)
Last scheduled appointment with you was 04/09/2020, Currently scheduled future appointment is Visit date not found,     Patient has been seen within the last year: Yes.    Confirmed preferred pharmacy for this refill encounter is   Preferred Pharmacy     [facility] [phone] [name] [facility] [address]    [address] [place] [address]    Phone: [phone] Fax: [phone]    Hours: Not open 24 hours      .     [name], MA  09/16/2020, 15:19

## 2020-09-23 ENCOUNTER — Encounter (INDEPENDENT_AMBULATORY_CARE_PROVIDER_SITE_OTHER): Payer: Self-pay | Admitting: Family

## 2020-09-24 ENCOUNTER — Other Ambulatory Visit (INDEPENDENT_AMBULATORY_CARE_PROVIDER_SITE_OTHER): Payer: MEDICAID

## 2020-09-24 ENCOUNTER — Other Ambulatory Visit: Payer: MEDICAID | Attending: Family | Admitting: Family

## 2020-09-24 ENCOUNTER — Other Ambulatory Visit: Payer: Self-pay

## 2020-09-24 ENCOUNTER — Ambulatory Visit (INDEPENDENT_AMBULATORY_CARE_PROVIDER_SITE_OTHER): Payer: MEDICAID | Admitting: Family

## 2020-09-24 VITALS — BP 110/66 | HR 81 | Temp 97.0°F | Resp 18 | Ht 64.02 in | Wt 285.0 lb

## 2020-09-24 DIAGNOSIS — Z6841 Body Mass Index (BMI) 40.0 and over, adult: Secondary | ICD-10-CM

## 2020-09-24 DIAGNOSIS — R635 Abnormal weight gain: Secondary | ICD-10-CM

## 2020-09-24 DIAGNOSIS — Z87898 Personal history of other specified conditions: Secondary | ICD-10-CM

## 2020-09-24 DIAGNOSIS — F419 Anxiety disorder, unspecified: Secondary | ICD-10-CM

## 2020-09-24 DIAGNOSIS — F1991 Other psychoactive substance use, unspecified, in remission: Secondary | ICD-10-CM

## 2020-09-24 DIAGNOSIS — F32A Depression, unspecified: Secondary | ICD-10-CM

## 2020-09-24 LAB — THYROID STIMULATING HORMONE (SENSITIVE TSH): TSH: 4.11 u[IU]/mL (ref 0.465–4.680)

## 2020-09-24 LAB — COMPREHENSIVE METABOLIC PANEL, NON-FASTING
ALBUMIN: 4.2 g/dL (ref 3.5–5.0)
ALKALINE PHOSPHATASE: 60 U/L (ref 38–126)
ALT (SGPT): 12 U/L (ref <=35)
ANION GAP: 7 mmol/L
AST (SGOT): 22 U/L (ref 14–36)
BILIRUBIN TOTAL: 0.4 mg/dL (ref 0.2–5.0)
BUN/CREA RATIO: 19
BUN: 16 mg/dL (ref 7–17)
CALCIUM: 9.6 mg/dL (ref 8.4–10.2)
CHLORIDE: 104 mmol/L (ref 98–107)
CO2 TOTAL: 27 mmol/L (ref 22–30)
CREATININE: 0.84 mg/dL (ref 0.52–1.04)
ESTIMATED GFR: 93 mL/min/{1.73_m2} (ref >=60)
GLUCOSE: 78 mg/dL (ref 74–106)
POTASSIUM: 5.1 mmol/L (ref 3.5–5.1)
PROTEIN TOTAL: 7.4 g/dL (ref 6.3–8.2)
SODIUM: 138 mmol/L (ref 137–145)

## 2020-09-24 LAB — HGA1C (HEMOGLOBIN A1C WITH EST AVG GLUCOSE)
ESTIMATED AVERAGE GLUCOSE: 103 mg/dL
HEMOGLOBIN A1C: 5.2 % (ref 4.3–6.1)

## 2020-09-24 LAB — INSULIN, SERUM: INSULIN: 5.6 m[IU]/mL (ref 2.3–26.0)

## 2020-09-24 LAB — VITAMIN D 25 TOTAL: VITAMIN D 25, TOTAL: 35.1 ng/mL (ref 30.00–100.00)

## 2020-09-24 MED ORDER — FLUOXETINE 20 MG CAPSULE
20.0000 mg | ORAL_CAPSULE | Freq: Every day | ORAL | 0 refills | Status: DC
Start: 2020-09-24 — End: 2020-12-30

## 2020-09-24 NOTE — Nursing Note (Signed)
Pt c/o worsening depression and anxiety.

## 2020-09-24 NOTE — Assessment & Plan Note (Signed)
Chronic, uncontrolled problem  Patient is established with multiple counselors and will continue as scheduled  D/C SNRI  -- wean to 150mg  today.  After 1 week, wean to every other day, then stop.  Increase Prozac to 20mg   Discussed medication side effect profile  Referred to psychiatry  Will follow-up with me in 4-6 weeks for symptom recheck

## 2020-09-24 NOTE — Ancillary Notes (Signed)
Department of Community Practice     Venipuncture performed in office on right arm antecubital vein, dry pressure dressing was applied to site and patient tolerated it well.  Specimen was centrifuged, aliquoted as needed and specimen was labeled and packaged for transport.    Wil[place] Sports, PHLEBOTOMIST  09/24/2020, 10:10

## 2020-09-24 NOTE — Progress Notes (Unsigned)
PRIMARY CARE, MID- [facility]  [address] [place] [address]     Name: Amanda Levine MRN:  [id number]   Date: 09/24/2020 Age: 35 y.o.     PCP: Raoul Pitch, APRN,FNP-BC       Reason for Visit: Depression and Anxiety    History of Present Illness  Amanda Levine is a 35 y.o. female who is being seen today for depression and anxiety.     Problem   Weight Gain, Abnormal    Has gained 155lbs since Jan 2021 after stopping amphetamine use.   Is on methadone and recently weaned her dose down.   Would like to get off medication totally.      History of Intravenous Drug Use in Remission   Anxiety and Depression    Feels like depression and anxiety symptoms are worse.   Not eating or sleeping well.   Has increased anhedonia and is gaining more weight.   Has tried [employer] with no relief.   Currently working at [employer] and reports she is having difficulty at work due to her symptoms.   Has a counseling session 3x per month at [facility].          Past Medical History:   Diagnosis Date   . Anxiety    . Convulsions (CMS HCC)     with IV contrast x 1   . Crohn's colitis (CMS HCC)    . Depression    . Ectopic pregnancy    . Elevated liver enzymes    . Genital herpes    . Gilbert's disease    . H/O urinary tract infection     1 month ago   . History of intravenous drug use in remission    . 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 Insomnia    . Oral herpes    . Sciatica    . Viral hepatitis    . Viral hepatitis C     RESOLVED         Past Surgical History:   Procedure Laterality Date   . COLONOSCOPY  2016    [place]   . DILATION AND CURETTAGE, DIAGNOSTIC / THERAPEUTIC     . ECTOPIC PREGNANCY SURGERY     . HX CESAREAN SECTION      x 2   . HX TONSILLECTOMY     . HX TUBAL LIGATION     . KNEE SURGERY Left 07/09/2019    knee arthroscopy with partial medial meniscectomy and anterior cruciate ligament reconstruction bone to bone   . LIVER BIOPSY  05/2019          Medication:  busPIRone (BUSPAR) 15 mg Oral Tablet, TAKE 1 TABLET BY MOUTH THREE TIMES A DAY  meloxicam (MOBIC) 7.5 mg Oral Tablet, TAKE 1 TABLET BY MOUTH EVERY DAY  methadone (DOLOPHINE) 5 mg/5 mL Oral Solution, Take 110 mg by mouth Once a day  oxybutynin (DITROPAN) 5 mg Oral Tablet, Take 5 mg by mouth Three times a day [facility] Manson Passey [facility]  PROAIR HFA 90 mcg/actuation Inhalation oral inhaler, INHALE 2 PUFFS VIA INHALATION ROUTE EVERY 4 HOURS AS NEEDED FOR DYSPNEA  promethazine (PHENERGAN) 25 mg Oral Tablet, Take 1 Tablet by mouth Every 4 hours as needed  traZODone (DESYREL) 50 mg Oral Tablet, Take 1 Tablet (50 mg total) by mouth Every night for 90 days  valACYclovir (VALTREX) 500 mg  Oral Tablet, Take 1 Tablet (500 mg total) by mouth Once a day  buPROPion (WELLBUTRIN XL) 150 mg extended release 24 hr tablet, Take 2 Tablets (300 mg total) by mouth Once a day for 180 days  FLUoxetine (PROZAC) 10 mg Oral Capsule, Take 1 Capsule (10 mg total) by mouth Once a day  HYDROcodone-acetaminophen (NORCO) 5-325 mg Oral Tablet, Take 2 Tablets by mouth Every 6 hours as needed for Pain  predniSONE (DELTASONE) 10 mg Oral Tablet, 4 TABLETS QAM X 3 DAYS, THEN 3 TABLETS QAM X 3 DAYS, THEN 2 TABLETS QAM X 3 DAYS, THEN 1 TABLET QAM X 3 DAYS  PREMARIN 0.625 mg/gram Vaginal Cream, INSERT 1/2 GRAM VAGINALLY ONCE NIGHTLY    No facility-administered medications prior to visit.    Allergies:  Allergies   Allergen Reactions   . Shellfish Derived Rash   . Amoxicillin    . Iv Contrast Seizure   . Seafood [Shrimp]        Family Medical History:     Problem Relation (Age of Onset)    Cancer Sister    Depression Sister    Heart Attack Father, Maternal Grandfather    Hypertension (High Blood Pressure) Mother, Father    Liver Disease Sister          Social History     Tobacco Use   . Smoking status: Current Every Day Smoker     Packs/day: 0.25     Types: Cigarettes   . Smokeless tobacco: Never Used   . Tobacco comment:  instructed not to smoke after midnight the night before surgery   Vaping Use   . Vaping Use: Some days   Substance Use Topics   . Alcohol use: Not Currently   . Drug use: Not Currently     Types: Methamphetamines, Marijuana, Opioid, IV     Comment: hx of herion use---clean x 167 days       Review of Systems  Pertinent positives listed in HPI    Nursing Notes:   Jonn Shingles, LPN  16/38/45 3646  Signed  Pt c/o worsening depression and anxiety.       Physical Exam:  Vitals:    09/24/20 0930   BP: 110/66   Pulse: 81   Resp: 18   Temp: 36.1 C (97 F)   SpO2: 98%   Weight: 129 kg (285 lb)   Height: 1.626 m (5' 4.02")   BMI: 49      Physical Exam  Vitals and nursing note reviewed.   Constitutional:       Appearance: Normal appearance. She is obese. She is not ill-appearing.   Cardiovascular:      Rate and Rhythm: Regular rhythm.      Pulses: Normal pulses.      Heart sounds: Normal heart sounds.   Pulmonary:      Effort: Pulmonary effort is normal.      Breath sounds: Normal breath sounds.   Abdominal:      Tenderness: There is no abdominal tenderness.   Musculoskeletal:         General: No swelling.   Neurological:      Mental Status: She is oriented to person, place, and time.   Psychiatric:         Mood and Affect: Mood is anxious. Affect is tearful.         Speech: Speech normal.         Cognition and Memory: Cognition normal.  MDM    HEMOGLOBIN A1C  Lab Results   Component Value Date    HA1C 5.2 09/24/2020     COMPLETE BLOOD COUNT   Lab Results   Component Value Date    WBC 9.2 07/12/2020    HGB 14.4 07/12/2020    HCT 44.2 07/12/2020    PLTCNT 314 07/12/2020       DIFFERENTIAL  Lab Results   Component Value Date    PMNS 57 07/12/2020    MONOCYTES 9 07/12/2020    BASOPHILS 0 07/12/2020    BASOPHILS <0.10 07/12/2020    PMNABS 5.17 07/12/2020    LYMPHSABS 2.84 07/12/2020    EOSABS 0.27 07/12/2020    MONOSABS 0.85 07/12/2020     COMPREHENSIVE METABOLIC PANEL FASTING  Lab Results   Component Value Date    SODIUM  138 09/24/2020    POTASSIUM 5.1 09/24/2020    CHLORIDE 104 09/24/2020    CO2 27 09/24/2020    ANIONGAP 7 09/24/2020    BUN 16 09/24/2020    CREATININE 0.84 09/24/2020    CALCIUM 9.6 09/24/2020    ALBUMIN 4.2 09/24/2020    TOTALPROTEIN 7.4 09/24/2020    ALKPHOS 60 09/24/2020    AST 22 09/24/2020    ALT 12 09/24/2020     THYROID STIMULATING HORMONE  Lab Results   Component Value Date    TSH 4.110 09/24/2020          Assessment/Plan:  Problem List Items Addressed This Visit        Unprioritized    History of intravenous drug use in remission (Chronic)     Chronic, controlled problem  Congratulated patient on her decision for recovery  She should continue to follow with the [facility] treatment center and continue to discuss weaning methadone  Encourage regular substance abuse counseling which patient is currently attending.           Anxiety and depression - Primary (Chronic)     Chronic, uncontrolled problem  Patient is established with multiple counselors and will continue as scheduled  D/C SNRI  -- wean to 150mg  today.  After 1 week, wean to every other day, then stop.  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     Follow up: Return in about 1 month (around 10/24/2020).  Seek medical attention for new or worsening symptoms.  Patient has been seen in this clinic within the last 3 years.     Fidela Juneau, APRN,FNP-BC      This note was partially created using MModal Fluency Direct system (voice recognition software) and is inherently subject to errors including those of syntax and "sound-alike" substitutions which may escape proofreading.  In such instances, original meaning may be extrapolated by contextual derivation.

## 2020-09-27 NOTE — Assessment & Plan Note (Addendum)
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

## 2020-09-27 NOTE — Assessment & Plan Note (Signed)
Chronic, controlled problem  Congratulated patient on her decision for recovery  She should continue to follow with the [facility] treatment center and continue to discuss weaning methadone  Encourage regular substance abuse counseling which patient is currently attending.

## 2020-10-04 ENCOUNTER — Encounter (INDEPENDENT_AMBULATORY_CARE_PROVIDER_SITE_OTHER): Payer: Self-pay | Admitting: Family

## 2020-10-04 ENCOUNTER — Telehealth (INDEPENDENT_AMBULATORY_CARE_PROVIDER_SITE_OTHER): Payer: Self-pay | Admitting: Family

## 2020-10-04 DIAGNOSIS — G4700 Insomnia, unspecified: Secondary | ICD-10-CM

## 2020-10-04 MED ORDER — MIRTAZAPINE 15 MG TABLET
15.0000 mg | ORAL_TABLET | Freq: Every evening | ORAL | 0 refills | Status: DC
Start: 2020-10-04 — End: 2020-12-30

## 2020-10-04 NOTE — Telephone Encounter (Signed)
DO you want to try a new RX or fill Trazodone?

## 2020-10-04 NOTE — Telephone Encounter (Signed)
setn rx

## 2020-10-04 NOTE — Telephone Encounter (Signed)
-----   Message from Elleen L. Demarcus sent at 10/04/2020 12:30 PM EDT -----  Regarding: Med refill   I was in there an seen Jessica Sept. 23 an had discussed some issues I was having . An she hadn't called me in any other kind of sleeping pills so I didn't know if she was. Because I explained to her I'm still not sleeping. But even if she don't call me in any new ones to try I have no more refills on my trazodon which like I've stated before those don't really work. Also she said something about connecting me with a psychiatry appt an I haven't heard anything about that yet .Amanda Levine

## 2020-10-04 NOTE — Telephone Encounter (Signed)
Pt notified Via My Chart. [name], MA  10/04/2020, 15:24

## 2020-10-08 ENCOUNTER — Encounter (INDEPENDENT_AMBULATORY_CARE_PROVIDER_SITE_OTHER): Payer: Self-pay | Admitting: Family

## 2020-10-21 ENCOUNTER — Other Ambulatory Visit (INDEPENDENT_AMBULATORY_CARE_PROVIDER_SITE_OTHER): Payer: Self-pay | Admitting: Family

## 2020-10-21 DIAGNOSIS — M2392 Unspecified internal derangement of left knee: Secondary | ICD-10-CM

## 2020-10-21 DIAGNOSIS — M1712 Unilateral primary osteoarthritis, left knee: Secondary | ICD-10-CM

## 2020-10-21 NOTE — Telephone Encounter (Signed)
Last scheduled appointment with you was 09/24/2020, Currently scheduled future appointment is Visit date not found,     Patient has been seen within the last year: Yes.    Confirmed preferred pharmacy for this refill encounter is   Preferred Pharmacy     [phone] [name] [facility] [address]    [address] [place] [address]    Phone: [phone] Fax: [phone]    Hours: Not open 24 hours      .     [name], MA  10/21/2020, 13:02

## 2020-11-02 ENCOUNTER — Telehealth (INDEPENDENT_AMBULATORY_CARE_PROVIDER_SITE_OTHER): Payer: Self-pay | Admitting: Family

## 2020-11-02 ENCOUNTER — Encounter (INDEPENDENT_AMBULATORY_CARE_PROVIDER_SITE_OTHER): Payer: Self-pay | Admitting: Family

## 2020-11-02 DIAGNOSIS — Z6841 Body Mass Index (BMI) 40.0 and over, adult: Secondary | ICD-10-CM

## 2020-11-02 NOTE — Telephone Encounter (Signed)
-----   Message from Aoi L. Lawal sent at 11/02/2020 12:23 PM EDT -----  Regarding: Question  Contact: [phone]  [facility]. I got referred there by Shanda Bumps. When I was talking to my doctor tori today she told me to ask Shanda Bumps for either a referral to an endocrinologist ( weight loss person) or a way to do the [employer] teleahealth threw [facility].

## 2020-12-05 ENCOUNTER — Other Ambulatory Visit (INDEPENDENT_AMBULATORY_CARE_PROVIDER_SITE_OTHER): Payer: Self-pay | Admitting: Family

## 2020-12-05 DIAGNOSIS — F419 Anxiety disorder, unspecified: Secondary | ICD-10-CM

## 2020-12-06 NOTE — Telephone Encounter (Signed)
Last scheduled appointment with you was 09/24/2020, Currently scheduled future appointment is Visit date not found,     Patient has been seen within the last year: Yes.    Confirmed preferred pharmacy for this refill encounter is   Preferred Pharmacy     [facility] [phone] [name] [facility] [address]    [address] [place] [address]    Phone: [phone] Fax: [phone]    Hours: Not open 24 hours      .     [name], MA  12/06/2020, 08:04

## 2020-12-29 ENCOUNTER — Other Ambulatory Visit (INDEPENDENT_AMBULATORY_CARE_PROVIDER_SITE_OTHER): Payer: Self-pay | Admitting: Family

## 2020-12-29 ENCOUNTER — Encounter (INDEPENDENT_AMBULATORY_CARE_PROVIDER_SITE_OTHER): Payer: Self-pay | Admitting: Family

## 2020-12-29 DIAGNOSIS — Z8619 Personal history of other infectious and parasitic diseases: Secondary | ICD-10-CM

## 2020-12-29 NOTE — Nursing Note (Deleted)
Last scheduled appointment with you was 09/24/2020, Currently scheduled future appointment is Visit date not found,     Pt was to follow up around 10/24/20    Note to Pharmacy: Pt needs to schedule follow up    Patient has been seen within the last year: Yes.    Confirmed preferred pharmacy for this refill encounter is   Preferred Pharmacy     [facility] [phone] [name] [facility] [address]    [address] [place] [address]    Phone: [phone] Fax: [phone]    Hours: Not open 24 hours      .     [name], MA  12/29/2020, 15:40

## 2020-12-30 ENCOUNTER — Other Ambulatory Visit (INDEPENDENT_AMBULATORY_CARE_PROVIDER_SITE_OTHER): Payer: Self-pay | Admitting: Family

## 2020-12-30 ENCOUNTER — Telehealth (INDEPENDENT_AMBULATORY_CARE_PROVIDER_SITE_OTHER): Payer: Self-pay | Admitting: Family

## 2020-12-30 DIAGNOSIS — Z8619 Personal history of other infectious and parasitic diseases: Secondary | ICD-10-CM

## 2020-12-30 DIAGNOSIS — G4700 Insomnia, unspecified: Secondary | ICD-10-CM

## 2020-12-30 DIAGNOSIS — F32A Depression, unspecified: Secondary | ICD-10-CM

## 2020-12-30 MED ORDER — VALACYCLOVIR 500 MG TABLET
500.0000 mg | ORAL_TABLET | Freq: Every day | ORAL | 1 refills | Status: DC
Start: 2020-12-30 — End: 2021-04-15

## 2020-12-30 NOTE — Telephone Encounter (Signed)
Last scheduled appointment with you was 09/24/2020, Currently scheduled future appointment is 12/30/2020,     Pt was to follow up in October 2022    Patient has been seen within the last year: Yes.    Confirmed preferred pharmacy for this refill encounter is   Preferred Pharmacy     [facility] [phone] [name] [facility] [address]    [address] [place] [address]    Phone: [phone] Fax: [phone]    Hours: Not open 24 hours      .     [name], MA  12/30/2020, 07:48

## 2020-12-30 NOTE — Telephone Encounter (Signed)
-----   Message from [name] sent at 12/30/2020 10:07 AM EST -----  Regarding: Refill  Contact: [phone]  I sent up an appt. for January  4th @8 :30am.  Will I get a refill for my med then or will she call it in today. Also I haven't had a period for 3 months now. I'm fixed so I know there's 100% chance I'm not pregnant an I've been having severe pain in my lower stomach i dont know if its because of that or not but its Below my belly button but above my hair line. An my [facility] doc put me on some new meds . I tried to add them on my chart but how she has me taken them isn't a suggestion for the MG they are on there  it's hard to explain. And I already went ahead an done my e-check in to.

## 2020-12-30 NOTE — Telephone Encounter (Signed)
-----   Message from Renesmay L. Denk sent at 12/29/2020  6:03 PM EST -----  Regarding: Refill  Contact: [phone]  My Valcyclever 500mg  7 day supply tablets I can't get refilled any more my insurance wants a new prescription sent in before they will refill them even tho there's like 45 refills left. I tried to do a refill request threw that option an it said I need to contact my clinic because they could t refill them at this time so what do I need to do. Because ice been out of them for like 4 or 5 days I don't want to start breaking out!!

## 2020-12-30 NOTE — Telephone Encounter (Signed)
Last scheduled appointment with you was 09/24/2020, Currently scheduled future appointment is Visit date not found,     Patient has been seen within the last year: Yes     Pt was to follow up around 10/24/20    Last fill was 09/16/20 #90 R3    Confirmed preferred pharmacy for this refill encounter is   Preferred Pharmacy     [facility] [phone] [name] [facility] [address]    [address] [place] [address]    Phone: [phone] Fax: [phone]    Hours: Not open 24 hours      .     [name], MA  12/30/2020, 07:40

## 2020-12-31 NOTE — Telephone Encounter (Signed)
Patient advised to keep appt for follow up

## 2021-01-05 ENCOUNTER — Other Ambulatory Visit: Payer: Self-pay

## 2021-01-05 ENCOUNTER — Ambulatory Visit (INDEPENDENT_AMBULATORY_CARE_PROVIDER_SITE_OTHER): Payer: MEDICAID | Admitting: Family

## 2021-01-05 ENCOUNTER — Other Ambulatory Visit (INDEPENDENT_AMBULATORY_CARE_PROVIDER_SITE_OTHER): Payer: MEDICAID

## 2021-01-05 ENCOUNTER — Encounter (INDEPENDENT_AMBULATORY_CARE_PROVIDER_SITE_OTHER): Payer: Self-pay | Admitting: Family

## 2021-01-05 VITALS — BP 124/66 | HR 96 | Temp 96.2°F | Resp 16 | Ht 64.02 in | Wt 287.6 lb

## 2021-01-05 DIAGNOSIS — Z6841 Body Mass Index (BMI) 40.0 and over, adult: Secondary | ICD-10-CM

## 2021-01-05 DIAGNOSIS — R635 Abnormal weight gain: Secondary | ICD-10-CM

## 2021-01-05 DIAGNOSIS — N912 Amenorrhea, unspecified: Secondary | ICD-10-CM

## 2021-01-05 DIAGNOSIS — F419 Anxiety disorder, unspecified: Secondary | ICD-10-CM

## 2021-01-05 DIAGNOSIS — F32A Depression, unspecified: Secondary | ICD-10-CM

## 2021-01-05 DIAGNOSIS — E8881 Metabolic syndrome: Secondary | ICD-10-CM

## 2021-01-05 LAB — COMPREHENSIVE METABOLIC PANEL, NON-FASTING
ALBUMIN: 4.4 g/dL (ref 3.5–5.0)
ALKALINE PHOSPHATASE: 72 U/L (ref 38–126)
ALT (SGPT): 19 U/L (ref <=35)
ANION GAP: 11 mmol/L
AST (SGOT): 24 U/L (ref 14–36)
BILIRUBIN TOTAL: 0.4 mg/dL (ref 0.2–1.3)
BUN/CREA RATIO: 15
BUN: 12 mg/dL (ref 7–17)
CALCIUM: 9.8 mg/dL (ref 8.4–10.2)
CHLORIDE: 103 mmol/L (ref 98–107)
CO2 TOTAL: 26 mmol/L (ref 22–30)
CREATININE: 0.8 mg/dL (ref 0.52–1.04)
ESTIMATED GFR: 99 mL/min/{1.73_m2} (ref >=60)
GLUCOSE: 88 mg/dL (ref 74–106)
POTASSIUM: 4.2 mmol/L (ref 3.5–5.1)
PROTEIN TOTAL: 7.4 g/dL (ref 6.3–8.2)
SODIUM: 140 mmol/L (ref 137–145)

## 2021-01-05 LAB — LIPID PANEL
CHOLESTEROL: 232 mg/dL — ABNORMAL HIGH (ref 0–199)
HDL CHOL: 34 mg/dL — ABNORMAL LOW (ref 40–60)
LDL CALC: 130 mg/dL (ref 0–130)
TRIGLYCERIDES: 340 mg/dL — ABNORMAL HIGH (ref 0–149)
VLDL CALC: 68 mg/dL

## 2021-01-05 LAB — HCG, URINE QUALITATIVE, PREGNANCY: HCG URINE QUALITATIVE: NEGATIVE

## 2021-01-05 MED ORDER — OZEMPIC 0.25 MG OR 0.5 MG (2 MG/1.5 ML) SUBCUTANEOUS PEN INJECTOR
0.50 mg | PEN_INJECTOR | SUBCUTANEOUS | 3 refills | Status: DC
Start: 2021-01-05 — End: 2021-01-05

## 2021-01-05 MED ORDER — PHENTERMINE 37.5 MG TABLET
37.5000 mg | ORAL_TABLET | Freq: Every morning | ORAL | 1 refills | Status: DC
Start: 2021-01-05 — End: 2021-03-08

## 2021-01-05 MED ORDER — WEGOVY 0.25 MG/0.5 ML SUBCUTANEOUS PEN INJECTOR
0.2500 mg | PEN_INJECTOR | SUBCUTANEOUS | 0 refills | Status: DC
Start: 2021-01-05 — End: 2021-03-08
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
## 2021-01-05 NOTE — Nursing Note (Signed)
01/05/21 0836   PHQ 9 (follow up)   Little interest or pleasure in doing things. 3   Feeling down, depressed, or hopeless 3   Trouble falling or staying asleep, or sleeping too much. 3   Feeling tired or having little energy 3   Poor appetite or overeating 3   Feeling bad about yourself/ that you are a failure in the past 2 weeks? 3   Trouble concentrating on things in the past 2 weeks? 0   Moving/Speaking slowly or being fidgety or restless  in the past 2 weeks? 0   Thoughts that you would be better off DEAD, or of hurting yourself in some way. 0   If you checked off any problems, how difficult have these problems made it for you to do your work, take care of things at home, or get along with other people? Somewhat difficult   PHQ 9 Total 18   Interpretation of Total Score Moderate/Severe depression

## 2021-01-05 NOTE — Assessment & Plan Note (Signed)
Chronic, uncontrolled problem  Likely s/t methadone treatment   Advised controlled carbohydrate diet   Start Adipex  Reviewed medication side effect profile  Discussed risk related to stimulant type medications  Patient has regular drug testing and advised this will show up on report  Will refer to nutrition  Patient requests referral to Endo -- advised all labs have been normal and there is no obvious endocrine dysfunction, but will place order for consult.

## 2021-01-05 NOTE — Assessment & Plan Note (Signed)
Chronic, Controlled problem  Patient is established with multiple counselors and will continue as scheduled  Continue Prozac  Continue Buspar  Follow up with [facility] as scheduled.

## 2021-01-05 NOTE — Assessment & Plan Note (Signed)
Chronic, Uncontrolled Problem  Check urine pregnancy today  Will refer for transvaginal U/S

## 2021-01-05 NOTE — Ancillary Notes (Signed)
Department of Community Practice     Venipuncture performed in office on right arm antecubital vein, dry pressure dressing was applied to site and patient tolerated it well.  Specimen was centrifuged, aliquoted as needed and specimen was labeled and packaged for transport.    Wilford Sports, PHLEBOTOMIST  01/05/2021, 09:12

## 2021-01-06 ENCOUNTER — Encounter (INDEPENDENT_AMBULATORY_CARE_PROVIDER_SITE_OTHER): Payer: Self-pay

## 2021-01-06 ENCOUNTER — Telehealth (INDEPENDENT_AMBULATORY_CARE_PROVIDER_SITE_OTHER): Payer: Self-pay | Admitting: Family

## 2021-01-06 NOTE — Telephone Encounter (Signed)
Reviewed a fax from the Pharmacy that Amanda Levine is not covered with Pt's insurance. Sent Pt a My Chart message with the above info. [name], MA  01/06/2021, 17:35

## 2021-01-07 ENCOUNTER — Encounter (INDEPENDENT_AMBULATORY_CARE_PROVIDER_SITE_OTHER): Payer: Self-pay | Admitting: Family

## 2021-01-07 ENCOUNTER — Other Ambulatory Visit (INDEPENDENT_AMBULATORY_CARE_PROVIDER_SITE_OTHER): Payer: Self-pay

## 2021-01-13 ENCOUNTER — Other Ambulatory Visit: Payer: Self-pay

## 2021-01-13 ENCOUNTER — Other Ambulatory Visit (INDEPENDENT_AMBULATORY_CARE_PROVIDER_SITE_OTHER): Payer: Self-pay | Admitting: Family

## 2021-01-13 ENCOUNTER — Other Ambulatory Visit (INDEPENDENT_AMBULATORY_CARE_PROVIDER_SITE_OTHER): Payer: MEDICAID

## 2021-01-13 DIAGNOSIS — N912 Amenorrhea, unspecified: Secondary | ICD-10-CM

## 2021-01-13 DIAGNOSIS — N83319 Acquired atrophy of ovary, unspecified side: Secondary | ICD-10-CM

## 2021-01-14 ENCOUNTER — Other Ambulatory Visit (INDEPENDENT_AMBULATORY_CARE_PROVIDER_SITE_OTHER): Payer: MEDICAID

## 2021-01-14 ENCOUNTER — Other Ambulatory Visit: Payer: MEDICAID | Attending: Family | Admitting: Family

## 2021-01-14 DIAGNOSIS — N83319 Acquired atrophy of ovary, unspecified side: Secondary | ICD-10-CM | POA: Insufficient documentation

## 2021-01-14 LAB — FSH: FSH: 3.2 m[IU]/mL

## 2021-01-14 LAB — PROLACTIN: PROLACTIN: 33 ng/mL — ABNORMAL HIGH (ref 5.2–26.5)

## 2021-01-14 LAB — ESTRADIOL: ESTRADIOL: <24 pg/mL

## 2021-01-14 LAB — CORTISOL, PLASMA OR SERUM: CORTISOL: 4.6 ug/dL — ABNORMAL LOW (ref 7.0–25.0)

## 2021-01-14 LAB — LH: LUTEINIZING HORMONE: 0.2 m[IU]/mL

## 2021-01-14 LAB — THYROXINE, FREE (FREE T4): THYROXINE (T4), FREE: 1.2 ng/dL (ref 0.78–2.19)

## 2021-01-14 NOTE — Ancillary Notes (Signed)
Department of Community Practice     Venipuncture performed in office on right arm antecubital vein, dry pressure dressing was applied to site and patient tolerated it well.  Specimen was centrifuged, aliquoted as needed and specimen was labeled and packaged for transport.    Wilford Sports, PHLEBOTOMIST  01/14/2021, 14:32

## 2021-01-18 ENCOUNTER — Encounter (INDEPENDENT_AMBULATORY_CARE_PROVIDER_SITE_OTHER): Payer: Self-pay | Admitting: Family

## 2021-01-19 ENCOUNTER — Encounter (INDEPENDENT_AMBULATORY_CARE_PROVIDER_SITE_OTHER): Payer: Self-pay | Admitting: Family

## 2021-01-19 ENCOUNTER — Telehealth (INDEPENDENT_AMBULATORY_CARE_PROVIDER_SITE_OTHER): Payer: Self-pay | Admitting: Family

## 2021-01-19 NOTE — Telephone Encounter (Signed)
Pt notified of results done 01/14/21     Reymundo Poll, RN   01/14/2021 9:10 AM EST Back to Top      Patient notified and v/u. Will come in today for labs.   HJB RN                Pt notified of normal labs done on 01/05/21    Hi Tiearra,    Janett Billow reviewed your labs and they are Within normal limits. If you have any further questions or concerns please let us know. ...   Written by [name], MA on 01/05/2021 3:43 PM EST View Full Comments  Seen by patient [name] on 01/08/2021 7:23 AM

## 2021-01-19 NOTE — Telephone Encounter (Signed)
-----   Message from B[place]mer. Caamano sent at 01/18/2021  7:02 PM EST -----  Regarding: Lab results   Contact: [phone]  I had some blood work done on Friday the 13th 2023. And my results has been posted a few hrs after having it done on Friday an Noone has called me or messaged me an said anything about it. This happen the last 2 times to when I had blood work done. An I haven't known the results till I've messaged in an asked. Is it my responsibility to message someone every time I have labs done to see what my results are or is it sopost to be u all responsibility to Contact the patient??  Either way don't matter I just need to know the results. Please!

## 2021-01-19 NOTE — Telephone Encounter (Signed)
My chart message sent to patient.  Still waiting on labs to be complete.

## 2021-01-20 ENCOUNTER — Other Ambulatory Visit (INDEPENDENT_AMBULATORY_CARE_PROVIDER_SITE_OTHER): Payer: Self-pay | Admitting: Family

## 2021-01-20 DIAGNOSIS — R7989 Other specified abnormal findings of blood chemistry: Secondary | ICD-10-CM

## 2021-01-22 LAB — INSULIN-LIKE GROWTH FACTOR 1, SERUM
IGF-1,LCMS: 86 ng/mL (ref 53–331)
Z-SCORE (FEMALE): -1.1 SD (ref ?–2.0)

## 2021-01-24 ENCOUNTER — Inpatient Hospital Stay
Admission: RE | Admit: 2021-01-24 | Discharge: 2021-01-24 | Disposition: A | Discharge status: Home / Self Care | Payer: MEDICAID | Source: Ambulatory Visit | Attending: Family | Admitting: Family

## 2021-01-24 ENCOUNTER — Other Ambulatory Visit: Payer: Self-pay

## 2021-01-24 ENCOUNTER — Other Ambulatory Visit (INDEPENDENT_AMBULATORY_CARE_PROVIDER_SITE_OTHER): Payer: Self-pay | Admitting: Family

## 2021-01-24 DIAGNOSIS — R7989 Other specified abnormal findings of blood chemistry: Secondary | ICD-10-CM | POA: Insufficient documentation

## 2021-01-24 DIAGNOSIS — R635 Abnormal weight gain: Secondary | ICD-10-CM

## 2021-01-24 MED ORDER — GADOBENATE DIMEGLUMINE 529 MG/ML(0.1 MMOL/0.2 ML) INTRAVENOUS SOLUTION
15.0000 mL | INTRAVENOUS | Status: AC
Start: 2021-01-24 — End: 2021-01-24
  Administered 2021-01-24: 15 mL via INTRAVENOUS

## 2021-02-08 ENCOUNTER — Other Ambulatory Visit: Payer: Self-pay

## 2021-02-08 ENCOUNTER — Ambulatory Visit (HOSPITAL_BASED_OUTPATIENT_CLINIC_OR_DEPARTMENT_OTHER): Payer: MEDICAID

## 2021-02-08 ENCOUNTER — Other Ambulatory Visit: Payer: MEDICAID | Attending: Family

## 2021-02-08 ENCOUNTER — Other Ambulatory Visit (HOSPITAL_BASED_OUTPATIENT_CLINIC_OR_DEPARTMENT_OTHER): Payer: Self-pay | Admitting: Family Medicine

## 2021-02-08 DIAGNOSIS — Z79899 Other long term (current) drug therapy: Secondary | ICD-10-CM

## 2021-02-08 DIAGNOSIS — F1120 Opioid dependence, uncomplicated: Secondary | ICD-10-CM | POA: Insufficient documentation

## 2021-02-08 LAB — CBC WITH DIFF
BASOPHIL #: <0.1 10*3/uL (ref <=0.20)
BASOPHIL %: 0 %
EOSINOPHIL #: 0.31 10*3/uL (ref <=0.50)
EOSINOPHIL %: 3 %
HCT: 43 % (ref 34.8–46.0)
HGB: 14.4 g/dL (ref 11.5–16.0)
IMMATURE GRANULOCYTE #: <0.1 10*3/uL (ref <0.10)
IMMATURE GRANULOCYTE %: 0 % (ref 0–1)
LYMPHOCYTE #: 2.98 10*3/uL (ref 1.00–4.80)
LYMPHOCYTE %: 31 %
MCH: 30.6 pg (ref 26.0–32.0)
MCHC: 33.5 g/dL (ref 31.0–35.5)
MCV: 91.5 fL (ref 78.0–100.0)
MONOCYTE #: 0.75 10*3/uL (ref 0.20–1.10)
MONOCYTE %: 8 %
MPV: 9.7 fL (ref 8.7–12.5)
NEUTROPHIL #: 5.59 10*3/uL (ref 1.50–7.70)
NEUTROPHIL %: 58 %
PLATELETS: 309 10*3/uL (ref 150–400)
RBC: 4.7 10*6/uL (ref 3.85–5.22)
RDW-CV: 13.1 % (ref 11.5–15.5)
WBC: 9.7 10*3/uL (ref 3.7–11.0)

## 2021-02-08 LAB — COMPREHENSIVE METABOLIC PANEL, NON-FASTING
ALBUMIN: 4 g/dL (ref 3.5–5.0)
ALKALINE PHOSPHATASE: 76 U/L (ref 40–110)
ALT (SGPT): 13 U/L (ref 8–22)
ANION GAP: 9 mmol/L (ref 4–13)
AST (SGOT): 15 U/L (ref 8–45)
BILIRUBIN TOTAL: 0.3 mg/dL (ref 0.3–1.3)
BUN/CREA RATIO: 13 (ref 6–22)
BUN: 11 mg/dL (ref 8–25)
CALCIUM: 9.3 mg/dL (ref 8.5–10.0)
CHLORIDE: 104 mmol/L (ref 96–111)
CO2 TOTAL: 25 mmol/L (ref 22–30)
CREATININE: 0.83 mg/dL (ref 0.60–1.05)
ESTIMATED GFR: >90 mL/min/BSA (ref >=60)
GLUCOSE: 86 mg/dL (ref 65–125)
POTASSIUM: 4.1 mmol/L (ref 3.5–5.1)
PROTEIN TOTAL: 7.1 g/dL (ref 6.0–7.9)
SODIUM: 138 mmol/L (ref 136–145)

## 2021-02-08 LAB — ECG 12 LEAD
Atrial Rate: 81 {beats}/min
Calculated P Axis: 57 degrees
Calculated R Axis: 56 degrees
Calculated T Axis: 25 degrees
PR Interval: 126 ms
QRS Duration: 78 ms
QT Interval: 396 ms
QTC Calculation: 460 ms
Ventricular rate: 81 {beats}/min

## 2021-02-08 LAB — HEPATITIS B SURFACE ANTIGEN: HBV SURFACE ANTIGEN QUALITATIVE: NEGATIVE

## 2021-02-08 LAB — HIV1/HIV2 SCREEN, COMBINED ANTIGEN AND ANTIBODY: HIV SCREEN, COMBINED ANTIGEN & ANTIBODY: NEGATIVE

## 2021-02-08 LAB — HEPATITIS C ANTIBODY SCREEN WITH REFLEX TO HCV PCR: HCV ANTIBODY QUALITATIVE: REACTIVE — AB

## 2021-02-09 LAB — HEPATITIS C VIRUS (HCV) RNA DETECTION AND QUANTIFICATION, PCR, PLASMA: HCV QUANTITATIVE PCR: NOT DETECTED

## 2021-02-09 LAB — SYPHILIS, RAPID PLASMIN REAGIN (RPR), WITH TITER REFLEX: RPR QUALITATIVE: NONREACTIVE

## 2021-03-01 ENCOUNTER — Telehealth (INDEPENDENT_AMBULATORY_CARE_PROVIDER_SITE_OTHER): Payer: Self-pay | Admitting: Family

## 2021-03-01 ENCOUNTER — Encounter (INDEPENDENT_AMBULATORY_CARE_PROVIDER_SITE_OTHER): Payer: Self-pay | Admitting: Family

## 2021-03-01 NOTE — Telephone Encounter (Signed)
-----   Message from Rumaysa L. Rossy sent at 03/01/2021 12:22 PM EST -----  Regarding: Med refill question  Contact: [phone]  Good afternoon.  I have a question about the adipex that I'm on. On January 4th when I had my office visit with Shanda [facility] and got put on the meds she had said I'd be on them for 3 months then I'd have to take a break. Which is completely fine but she only wrote me out 1 refill when she gave me the prescription . So she called in the first months on January 4th an then I got the 2nd month refilled at the end of January  but I don't have another refill for my 3rd month. I was just wondering as to why. I thought it was maybe because she's sending me to that weight specialist an she was going to see what they were going to do but even then that's not till 3/30 .  So just curious.  Thank you for your time have a great day    Amanda Levine

## 2021-03-01 NOTE — Telephone Encounter (Signed)
We usually like for them to come in and have a weight check to make sure it is working.

## 2021-03-02 NOTE — Telephone Encounter (Signed)
Pt sent My chart message with below info. [name], MA  03/02/2021, 13:26

## 2021-03-08 ENCOUNTER — Other Ambulatory Visit: Payer: Self-pay

## 2021-03-08 ENCOUNTER — Encounter (INDEPENDENT_AMBULATORY_CARE_PROVIDER_SITE_OTHER): Payer: Self-pay | Admitting: Family

## 2021-03-08 ENCOUNTER — Ambulatory Visit (INDEPENDENT_AMBULATORY_CARE_PROVIDER_SITE_OTHER): Payer: MEDICAID | Admitting: Family

## 2021-03-08 VITALS — BP 122/74 | HR 86 | Temp 96.5°F | Resp 16 | Ht 64.05 in | Wt 275.0 lb

## 2021-03-08 DIAGNOSIS — F17200 Nicotine dependence, unspecified, uncomplicated: Secondary | ICD-10-CM | POA: Insufficient documentation

## 2021-03-08 DIAGNOSIS — Z6841 Body Mass Index (BMI) 40.0 and over, adult: Secondary | ICD-10-CM

## 2021-03-08 DIAGNOSIS — Z1331 Encounter for screening for depression: Secondary | ICD-10-CM

## 2021-03-08 DIAGNOSIS — F17210 Nicotine dependence, cigarettes, uncomplicated: Secondary | ICD-10-CM

## 2021-03-08 DIAGNOSIS — R635 Abnormal weight gain: Secondary | ICD-10-CM

## 2021-03-08 DIAGNOSIS — L732 Hidradenitis suppurativa: Secondary | ICD-10-CM

## 2021-03-08 MED ORDER — PHENTERMINE 37.5 MG TABLET
37.5000 mg | ORAL_TABLET | Freq: Every morning | ORAL | 1 refills | Status: DC
Start: 2021-03-08 — End: 2021-04-14

## 2021-03-08 MED ORDER — CLINDAMYCIN HCL 300 MG CAPSULE
300.0000 mg | ORAL_CAPSULE | Freq: Three times a day (TID) | ORAL | 0 refills | Status: DC
Start: 2021-03-08 — End: 2021-03-24

## 2021-03-08 MED ORDER — CLINDAMYCIN 1 % TOPICAL GEL, ONCE DAILY
1.0000 | Freq: Every day | CUTANEOUS | 1 refills | Status: AC
Start: 2021-03-08 — End: 2021-03-18

## 2021-03-08 NOTE — Assessment & Plan Note (Signed)
Tobacco cessation counseling performed.

## 2021-03-08 NOTE — Nursing Note (Signed)
Pt was started on Adipex Jan 4th and was 287. Pt is now 275.     Pt report 2 knots on her right groin she would like checked.

## 2021-03-08 NOTE — Nursing Note (Signed)
03/08/21 1116   PHQ 9 (follow up)   Little interest or pleasure in doing things. 2   Feeling down, depressed, or hopeless 2   Trouble falling or staying asleep, or sleeping too much. 2   Feeling tired or having little energy 2   Poor appetite or overeating 2   Feeling bad about yourself/ that you are a failure in the past 2 weeks? 2   Trouble concentrating on things in the past 2 weeks? 2   Moving/Speaking slowly or being fidgety or restless  in the past 2 weeks? 0   Thoughts that you would be better off DEAD, or of hurting yourself in some way. 0   If you checked off any problems, how difficult have these problems made it for you to do your work, take care of things at home, or get along with other people? Very difficult   PHQ 9 Total 14   Interpretation of Total Score Moderate depression

## 2021-03-08 NOTE — Progress Notes (Signed)
PRIMARY CARE, MID- [facility]  [address] [place] [address]     Name: Amanda Levine MRN:  [id number]   Date: 03/08/2021 Age: 36 y.o.     PCP: Amanda Pitch, APRN,FNP-BC       Reason for Visit: Weight Loss    History of Present Illness  Amanda Levine is a 36 y.o. female who is being seen today for weight loss follow-up.     Problem   Hidradenitis Suppurativa    Presents today complaining of red pimple like lesions along the bikini line.    She reports these are hard and tender.    At this time they are not draining.  She reports they have become effects in the past and drained.    She is not treating with anything over-the-counter at this time.     Smoker    0.25 ppd       Weight Gain, Abnormal    Has gained 155lbs since Jan 2021 after stopping amphetamine use.   Has trialed Adipex for 2 months and had 12 lb weight loss.  Is on methadone and recently weaned her dose down.   Has been walking some but works 12-16 hours and does not routinely workout  Reduced carbs in diet and no soda            Past Medical History:   Diagnosis Date   . Abnormal cervical Pap smear with positive HPV DNA test 05/21/2014   . Anxiety    . Convulsions (CMS HCC)     with IV contrast x 1   . Crohn's colitis (CMS HCC)    . Depression    . Ectopic pregnancy    . Elevated liver enzymes    . Genital herpes    . Gilbert's disease    . H/O urinary tract infection     1 month ago   . History of C-section 05/21/2014   . History of intravenous drug use in remission    . History of MRSA infection 05/21/2014   . History of phlebitis 04/09/2019   . 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 Insomnia    . Oral herpes    . Prior perinatal loss, antepartum, second trimester 05/21/2014   . Sciatica    . Viral hepatitis    . Viral hepatitis C     RESOLVED         Past Surgical History:   Procedure Laterality Date   . COLONOSCOPY  2016    [place]   . DILATION AND CURETTAGE, DIAGNOSTIC / THERAPEUTIC     . ECTOPIC PREGNANCY  SURGERY     . HX CESAREAN SECTION      x 2   . HX TONSILLECTOMY     . HX TUBAL LIGATION     . KNEE SURGERY Left 07/09/2019    knee arthroscopy with partial medial meniscectomy and anterior cruciate ligament reconstruction bone to bone   . LIVER BIOPSY  05/2019         Medication:  busPIRone (BUSPAR) 15 mg Oral Tablet, TAKE 1 TABLET BY MOUTH THREE TIMES A DAY (Patient taking differently: Three times a day 20 mg TID)  FLUoxetine (PROZAC) 20 mg Oral Capsule, TAKE 1 CAPSULE BY MOUTH ONCE DAILY (Patient taking differently: 2 Capsules (40 mg total) Managed with [facility])  meloxicam (MOBIC) 7.5 mg Oral Tablet, TAKE 1 TABLET BY MOUTH EVERY DAY  methadone (DOLOPHINE) 5 mg/5 mL Oral Solution, Take 110 mL (110 mg total) by mouth Once a day 130mg  once daily  prazosin (MINIPRESS) 1 mg Oral Capsule, [facility] manages RX (Patient not taking: Reported on 03/08/2021)  prazosin (MINIPRESS) 2 mg Oral Capsule, Take 1 Capsule (2 mg total) by mouth Every night [facility] manages RX  PROAIR HFA 90 mcg/actuation Inhalation oral inhaler, INHALE 2 PUFFS VIA INHALATION ROUTE EVERY 4 HOURS AS NEEDED FOR DYSPNEA  semaglutide, weight loss, (WEGOVY) 0.25 mg/0.5 mL Subcutaneous Pen Injector, Inject 0.5 mL (0.25 mg total) under the skin Every 7 days for 30 days  topiramate (TOPAMAX) 25 mg Oral Tablet, Take 4 Tablets (100 mg total) by mouth Once a day 05/08/2021  traZODone (DESYREL) 150 mg Oral Tablet, Take 1 Tablet (150 mg total) by mouth Every night 200 mg nightly    [facility] manages RX  valACYclovir (VALTREX) 500 mg Oral Tablet, Take 1 Tablet (500 mg total) by mouth Once a day for 180 days  phentermine (ADIPEX-P) 37.5 mg Oral Tablet, Take 1 Tablet (37.5 mg total) by mouth Every morning before breakfast for 60 days    No facility-administered medications prior to visit.    Allergies:  Allergies   Allergen Reactions   . Shellfish Derived Rash   . Amoxicillin    . Iv Contrast Seizure   . Seafood [Shrimp]        Family Medical  History:     Problem Relation (Age of Onset)    Cancer Sister    Depression Sister    Heart Attack Father, Maternal Grandfather    Hypertension (High Blood Pressure) Mother, Father    Liver Disease Sister          Social History     Tobacco Use   . Smoking status: Every Day     Packs/day: 0.25     Types: Cigarettes   . Smokeless tobacco: Never   . Tobacco comments:     instructed not to smoke after midnight the night before surgery   Vaping Use   . Vaping Use: Some days   Substance Use Topics   . Alcohol use: Not Currently   . Drug use: Not Currently     Types: Methamphetamines, Marijuana, Opioid, IV     Comment: hx of herion use---clean x 167 days       Review of Systems  Pertinent positives listed in HPI    Nursing Notes:   [employer], [name]  03/08/21 1116  Signed  Pt was started on Adipex Jan 4th and was 287. Pt is now 275.     Pt report 2 knots on her right groin she would like checked.    08-22-1982, MA  03/08/21 1118  Signed     03/08/21 1116   PHQ 9 (follow up)   Little interest or pleasure in doing things. 2   Feeling down, depressed, or hopeless 2   Trouble falling or staying asleep, or sleeping too much. 2   Feeling tired or having little energy 2   Poor appetite or overeating 2   Feeling bad about yourself/ that you are a failure in the past 2 weeks? 2   Trouble concentrating on things in the past 2 weeks? 2   Moving/Speaking slowly or being fidgety or restless  in the past 2 weeks? 0   Thoughts that you would be better off DEAD, or of hurting yourself in some way. 0   If you checked off any  problems, how difficult have these problems made it for you to do your work, take care of things at home, or get along with other people? Very difficult   PHQ 9 Total 14   Interpretation of Total Score Moderate depression           Physical Exam:  Vitals:    03/08/21 1113   BP: 122/74   Pulse: 86   Resp: 16   Temp: 35.8 C (96.5 F)   TempSrc: Tympanic   SpO2: 97%   Weight: 125 kg (275 lb)   Height:  1.627 m (5' 4.05")   BMI: 47.23      Physical Exam  Vitals and nursing note reviewed.   Constitutional:       Appearance: Normal appearance. She is obese.   Cardiovascular:      Rate and Rhythm: Regular rhythm.      Heart sounds: Normal heart sounds.   Pulmonary:      Breath sounds: Normal breath sounds.   Genitourinary:      Neurological:      Mental Status: She is oriented to person, place, and time.   Psychiatric:         Mood and Affect: Mood normal.         Behavior: Behavior normal.         MDM    HEMOGLOBIN A1C  Lab Results   Component Value Date    HA1C 5.2 09/24/2020        Assessment/Plan:  Problem List Items Addressed This Visit        Unprioritized    Weight gain, abnormal (Chronic)     Chronic, uncontrolled problem  Likely s/t methadone treatment   Advised controlled carbohydrate diet   Start Continue  Has been referred to Endo and appt is this month.          Relevant Medications    phentermine (ADIPEX-P) 37.5 mg Oral Tablet    Hidradenitis suppurativa - Primary (Chronic)     Chronic, uncontrolled problem  Will treat 2 active lesions with oral antibiotics   Advised can use clindamycin gel topically on any new lesions   Recommend weight loss, glycemic control, and avoidance of smoking         Relevant Medications    clindamycin phosphate (CLINDAGEL) 1 % Apply externally gel, once daily    clindamycin (CLEOCIN) 300 mg Oral Capsule    Smoker (Chronic)     Tobacco cessation counseling performed.               Follow up: Return for Routine Follow-up with PCP.  Seek medical attention for new or worsening symptoms.  Patient has been seen in this clinic within the last 3 years.     Amanda Pitch, APRN,FNP-BC      This note was partially created using MModal Fluency Direct system (voice recognition software) and is inherently subject to errors including those of syntax and "sound-alike" substitutions which may escape proofreading.  In such instances, original meaning may be extrapolated by contextual  derivation.

## 2021-03-08 NOTE — Assessment & Plan Note (Signed)
Chronic, uncontrolled problem  Will treat 2 active lesions with oral antibiotics   Advised can use clindamycin gel topically on any new lesions   Recommend weight loss, glycemic control, and avoidance of smoking

## 2021-03-08 NOTE — Assessment & Plan Note (Signed)
Chronic, uncontrolled problem  Likely s/t methadone treatment   Advised controlled carbohydrate diet   Start Continue  Has been referred to Endo and appt is this month.

## 2021-03-18 ENCOUNTER — Encounter (INDEPENDENT_AMBULATORY_CARE_PROVIDER_SITE_OTHER): Payer: Self-pay | Admitting: Family

## 2021-03-24 ENCOUNTER — Encounter (INDEPENDENT_AMBULATORY_CARE_PROVIDER_SITE_OTHER): Payer: Self-pay | Admitting: Family

## 2021-03-24 ENCOUNTER — Ambulatory Visit (INDEPENDENT_AMBULATORY_CARE_PROVIDER_SITE_OTHER): Payer: MEDICAID | Admitting: Family

## 2021-03-24 ENCOUNTER — Other Ambulatory Visit: Payer: Self-pay

## 2021-03-24 VITALS — BP 114/68 | HR 87 | Temp 95.6°F | Resp 14 | Ht 64.05 in | Wt 273.4 lb

## 2021-03-24 DIAGNOSIS — Z6841 Body Mass Index (BMI) 40.0 and over, adult: Secondary | ICD-10-CM

## 2021-03-24 DIAGNOSIS — R200 Anesthesia of skin: Secondary | ICD-10-CM

## 2021-03-24 DIAGNOSIS — M545 Low back pain, unspecified: Secondary | ICD-10-CM

## 2021-03-24 DIAGNOSIS — G8929 Other chronic pain: Secondary | ICD-10-CM

## 2021-03-24 DIAGNOSIS — R202 Paresthesia of skin: Secondary | ICD-10-CM

## 2021-03-24 NOTE — Nursing Note (Signed)
Pt c/o bilateral feet pain, swelling and foot and ankle numbness X 1 month

## 2021-03-24 NOTE — Progress Notes (Signed)
PRIMARY CARE, MID- [facility]  [address] [place] [address]     Name: Amanda Levine MRN:  [id number]   Date: 03/24/2021 Age: 36 y.o.     PCP: Raoul Pitch, APRN,FNP-BC     Reason for Visit: Leg Pain    History of Present Illness  Amanda Levine is a 36 y.o. female who is being seen today for bilateral feet pain.     Patient reports she is having intermittent numbness in both of her feet. Sometimes one is worse than the other.  The numbness is painful and the pain radiates up her legs. She has tried insoles in her shoes and compression socks with no relief.  Legs are not swelling. She does have a history of knee surgery on the left leg.  She has had a CT of the lumbar spine before but it was normal.     Past Medical History:   Diagnosis Date   . Abnormal cervical Pap smear with positive HPV DNA test 05/21/2014   . Anxiety    . Convulsions (CMS HCC)     with IV contrast x 1   . Crohn's colitis (CMS HCC)    . Depression    . Ectopic pregnancy    . Elevated liver enzymes    . Genital herpes    . Gilbert's disease    . H/O urinary tract infection     1 month ago   . History of C-section 05/21/2014   . History of intravenous drug use in remission    . History of MRSA infection 05/21/2014   . History of phlebitis 04/09/2019   . 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 Insomnia    . Oral herpes    . Prior perinatal loss, antepartum, second trimester 05/21/2014   . Sciatica    . Viral hepatitis    . Viral hepatitis C     RESOLVED       Past Surgical History:   Procedure Laterality Date   . COLONOSCOPY  2016    [place]   . DILATION AND CURETTAGE, DIAGNOSTIC / THERAPEUTIC     . ECTOPIC PREGNANCY SURGERY     . HX CESAREAN SECTION      x 2   . HX TONSILLECTOMY     . HX TUBAL LIGATION     . KNEE SURGERY Left 07/09/2019    knee arthroscopy with partial medial meniscectomy and anterior cruciate ligament reconstruction bone to bone   . LIVER BIOPSY  05/2019       Medication:  busPIRone (BUSPAR) 15  mg Oral Tablet, TAKE 1 TABLET BY MOUTH THREE TIMES A DAY (Patient taking differently: Three times a day 20 mg TID)  FLUoxetine (PROZAC) 20 mg Oral Capsule, TAKE 1 CAPSULE BY MOUTH ONCE DAILY (Patient taking differently: 3 Capsules (60 mg total) 60 mg  Managed with [facility])  meloxicam (MOBIC) 7.5 mg Oral Tablet, TAKE 1 TABLET BY MOUTH EVERY DAY  methadone (DOLOPHINE) 5 mg/5 mL Oral Solution, Take 110 mL (110 mg total) by mouth Once a day 130mg  once daily  phentermine (ADIPEX-P) 37.5 mg Oral Tablet, Take 1 Tablet (37.5 mg total) by mouth Every morning before breakfast for 60 days  prazosin (MINIPRESS) 2 mg Oral Capsule, Take 1 Capsule (2 mg total) by mouth Twice daily Takes 4 mg    RX  PROAIR HFA 90 mcg/actuation Inhalation oral inhaler, INHALE  2 PUFFS VIA INHALATION ROUTE EVERY 4 HOURS AS NEEDED FOR DYSPNEA  topiramate (TOPAMAX) 25 mg Oral Tablet, Take 4 Tablets (100 mg total) by mouth Once a day [employer]  traZODone (DESYREL) 150 mg Oral Tablet, Take 1 Tablet (150 mg total) by mouth Every night 200 mg nightly    [facility] manages RX  valACYclovir (VALTREX) 500 mg Oral Tablet, Take 1 Tablet (500 mg total) by mouth Once a day for 180 days  busPIRone (BUSPAR) 10 mg Oral Tablet, TAKE 2 TABLETS BY MOUTH 3 TIMES A DAY FOR 30 DAYS  clindamycin (CLEOCIN) 300 mg Oral Capsule, Take 1 Capsule (300 mg total) by mouth Three times a day for 7 days    No facility-administered medications prior to visit.    Allergies:  Allergies   Allergen Reactions   . Shellfish Derived Rash   . Amoxicillin    . Iv Contrast Seizure   . Seafood [Shrimp]        Family Medical History:     Problem Relation (Age of Onset)    Cancer Sister    Depression Sister    Heart Attack Father, Maternal Grandfather    Hypertension (High Blood Pressure) Mother, Father    Liver Disease Sister          Social History     Tobacco Use   . Smoking status: Every Day     Packs/day: 0.25     Types: Cigarettes   . Smokeless tobacco:  Never   . Tobacco comments:     instructed not to smoke after midnight the night before surgery   Vaping Use   . Vaping Use: Some days   Substance Use Topics   . Alcohol use: Not Currently   . Drug use: Not Currently     Types: Methamphetamines, Marijuana, Opioid, IV     Comment: hx of herion use---clean x 167 days       Review of Systems  Pertinent positives listed in HPI    Nursing Notes:   [name], [place]  03/24/21 0856  Signed  Pt c/o bilateral feet pain, swelling and foot and ankle numbness X 1 month      Physical Exam:  Vitals:    03/24/21 0853   BP: 114/68   Pulse: 87   Resp: 14   Temp: 35.3 C (95.6 F)   TempSrc: Tympanic   SpO2: 96%   Weight: 124 kg (273 lb 6.4 oz)   Height: 1.627 m (5' 4.05")   BMI: 46.95      Physical Exam  Vitals and nursing note reviewed.   Constitutional:       Appearance: Normal appearance. She is obese.   Cardiovascular:      Pulses:           Posterior tibial pulses are 1+ on the right side and 1+ on the left side.   Musculoskeletal:      Right lower leg: No edema.      Left lower leg: No edema.   Feet:      Right foot:      Skin integrity: Skin integrity normal.      Left foot:      Skin integrity: Skin integrity normal.   Skin:     General: Skin is warm and dry.      Findings: No rash.   Neurological:      Mental Status: She is alert and oriented to person, place, and time.   Psychiatric:  Mood and Affect: Mood normal.         Behavior: Behavior normal.       Assessment/Plan:  Problem List Items Addressed This Visit    None  Visit Diagnoses     Numbness and tingling of foot    -  Primary  Chronic bilateral low back pain without sciatica      Chronic, Uncontrolled Problem  Suspect neuropathy  Will request EMG from Neuro  If positive can consider Neurontin  If negative will plan for podiatry referral     Relevant Orders    Refer to [facility]) Neurology, [facility]     Tobacco cessation counseling performed.     Follow up: Return for Routine  Follow-up with PCP.  Seek medical attention for new or worsening symptoms.  Patient has been seen in this clinic within the last 3 years.     Raoul Pitch, APRN,FNP-BC      This note was partially created using MModal Fluency Direct system (voice recognition software) and is inherently subject to errors including those of syntax and "sound-alike" substitutions which may escape proofreading.  In such instances, original meaning may be extrapolated by contextual derivation.

## 2021-03-31 ENCOUNTER — Ambulatory Visit (HOSPITAL_BASED_OUTPATIENT_CLINIC_OR_DEPARTMENT_OTHER): Payer: MEDICAID

## 2021-03-31 ENCOUNTER — Other Ambulatory Visit (INDEPENDENT_AMBULATORY_CARE_PROVIDER_SITE_OTHER): Payer: Self-pay | Admitting: Family

## 2021-03-31 DIAGNOSIS — G4700 Insomnia, unspecified: Secondary | ICD-10-CM

## 2021-03-31 NOTE — Telephone Encounter (Signed)
Pt no longer taking.

## 2021-04-01 ENCOUNTER — Telehealth (INDEPENDENT_AMBULATORY_CARE_PROVIDER_SITE_OTHER): Payer: Self-pay | Admitting: Family

## 2021-04-01 MED ORDER — MELOXICAM 15 MG TABLET
15.0000 mg | ORAL_TABLET | Freq: Every day | ORAL | 0 refills | Status: DC
Start: 2021-04-01 — End: 2021-07-07

## 2021-04-01 NOTE — Telephone Encounter (Signed)
Pt notified via My Chart. [name], MA  04/01/2021, 14:41

## 2021-04-01 NOTE — Telephone Encounter (Signed)
Sent 15 mg    Can refer to podiatry but I know she is tired of seeing other people and not getting answers.

## 2021-04-01 NOTE — Telephone Encounter (Signed)
-----   Message from Lila L. Wineland sent at 04/01/2021  9:13 AM EDT -----  Regarding: My feet  Contact: [phone]  Good morning       1 of the meds I'm on is Meloxicam (mobic) 7.5mg  1 tablet po everyday. An I've been on this same ones the same dose the same everything since I had my knee surgery in 07/09/19 that's what mainly I am taken them for. An I'm starting to feel like there not working any more.  I dont know if it's the issuse from my feet that's affecting my knee an making it bother me if if it's just cause the mobic isn't working any more. Is there a higher dose in mobic other then 7.5mg . The ones I have now I have one refill left an I can refill them on 4/15 but there's no point to take them or refill them if there not helping any more. So I'm not sure what to do.

## 2021-04-05 ENCOUNTER — Ambulatory Visit: Payer: MEDICAID | Attending: NURSE PRACTITIONER

## 2021-04-05 ENCOUNTER — Other Ambulatory Visit: Payer: Self-pay

## 2021-04-05 DIAGNOSIS — Z79899 Other long term (current) drug therapy: Secondary | ICD-10-CM | POA: Insufficient documentation

## 2021-04-05 LAB — COMPREHENSIVE METABOLIC PANEL, NON-FASTING
ALBUMIN: 3.8 g/dL (ref 3.5–5.0)
ALKALINE PHOSPHATASE: 72 U/L (ref 40–110)
ALT (SGPT): 10 U/L (ref 8–22)
ANION GAP: 8 mmol/L (ref 4–13)
AST (SGOT): 11 U/L (ref 8–45)
BILIRUBIN TOTAL: 0.2 mg/dL — ABNORMAL LOW (ref 0.3–1.3)
BUN/CREA RATIO: 14 (ref 6–22)
BUN: 12 mg/dL (ref 8–25)
CALCIUM: 9.5 mg/dL (ref 8.5–10.0)
CHLORIDE: 106 mmol/L (ref 96–111)
CO2 TOTAL: 26 mmol/L (ref 22–30)
CREATININE: 0.84 mg/dL (ref 0.60–1.05)
ESTIMATED GFR: >90 mL/min/BSA (ref >=60)
GLUCOSE: 70 mg/dL (ref 65–125)
POTASSIUM: 4.7 mmol/L (ref 3.5–5.1)
PROTEIN TOTAL: 6.9 g/dL (ref 6.4–8.3)
SODIUM: 140 mmol/L (ref 136–145)

## 2021-04-05 LAB — CBC WITH DIFF
BASOPHIL #: <0.1 10*3/uL (ref <=0.20)
BASOPHIL %: 1 %
EOSINOPHIL #: 0.28 10*3/uL (ref <=0.50)
EOSINOPHIL %: 3 %
HCT: 45 % (ref 34.8–46.0)
HGB: 14.5 g/dL (ref 11.5–16.0)
IMMATURE GRANULOCYTE #: <0.1 10*3/uL (ref <0.10)
IMMATURE GRANULOCYTE %: 1 % (ref 0–1)
LYMPHOCYTE #: 3.02 10*3/uL (ref 1.00–4.80)
LYMPHOCYTE %: 27 %
MCH: 29.7 pg (ref 26.0–32.0)
MCHC: 32.2 g/dL (ref 31.0–35.5)
MCV: 92 fL (ref 78.0–100.0)
MONOCYTE #: 0.98 10*3/uL (ref 0.20–1.10)
MONOCYTE %: 9 %
MPV: 9.5 fL (ref 8.7–12.5)
NEUTROPHIL #: 6.63 10*3/uL (ref 1.50–7.70)
NEUTROPHIL %: 59 %
PLATELETS: 326 10*3/uL (ref 150–400)
RBC: 4.89 10*6/uL (ref 3.85–5.22)
RDW-CV: 13.2 % (ref 11.5–15.5)
WBC: 11 10*3/uL (ref 3.7–11.0)

## 2021-04-05 LAB — LIPID PANEL
CHOL/HDL RATIO: 6
CHOLESTEROL: 233 mg/dL — ABNORMAL HIGH (ref 100–200)
HDL CHOL: 39 mg/dL — ABNORMAL LOW (ref >=50)
LDL CALC: 143 mg/dL — ABNORMAL HIGH (ref <100)
NON-HDL: 194 mg/dL — ABNORMAL HIGH (ref <=190)
TRIGLYCERIDES: 282 mg/dL — ABNORMAL HIGH (ref <150)
VLDL CALC: 52 mg/dL — ABNORMAL HIGH (ref <30)

## 2021-04-05 LAB — VITAMIN B12: VITAMIN B 12: 449 pg/mL (ref 200–900)

## 2021-04-05 LAB — THYROID STIMULATING HORMONE (SENSITIVE TSH): TSH: 2.366 u[IU]/mL (ref 0.430–3.550)

## 2021-04-05 LAB — VITAMIN D 25 TOTAL: VITAMIN D 25, TOTAL: 21.9 ng/mL — ABNORMAL LOW (ref 30.0–100.0)

## 2021-04-11 ENCOUNTER — Encounter (INDEPENDENT_AMBULATORY_CARE_PROVIDER_SITE_OTHER): Payer: Self-pay | Admitting: Family

## 2021-04-11 ENCOUNTER — Telehealth (INDEPENDENT_AMBULATORY_CARE_PROVIDER_SITE_OTHER): Payer: Self-pay | Admitting: Family

## 2021-04-11 DIAGNOSIS — L600 Ingrowing nail: Secondary | ICD-10-CM

## 2021-04-11 NOTE — Telephone Encounter (Signed)
-----   Message from [place]. [place] sent at 04/11/2021  1:45 PM EDT -----  Regarding: In grown toe nails   Contact: [phone]  I know u guys are probably tired of hearing from me since I haven't yet been to the specialist that [facility]) have referred me to. Coming up this week an the wk after tho. But my question is does Jessica(you) handle in grown toe nails that's kinda bad and really sensitive. Or is that one I can call around some where to get in to see someone and if so who do I call.

## 2021-04-12 NOTE — Telephone Encounter (Signed)
We can treat with an antibiotic but if it is ingrown and needs removed that it is Podiatry.

## 2021-04-12 NOTE — Telephone Encounter (Signed)
Pt notified via My Chart. [name], MA  04/12/2021, 08:57

## 2021-04-13 NOTE — Progress Notes (Unsigned)
Endocrinology, [facility]  [address]  [place] [address]  [phone]    Date: 04/14/2021  Name: Amanda Levine  Age: 36 y.o.    Chief Complaint: Obesity    HPI    Amanda Levine is a 36 y.o. female  with history of hep C, IV drug use,Crohn's disease , anxiety/depression ,PTSD , mood disorder  who is referred by her PCP Fidela Juneau for consultation for weight gain and elevated prolactin level.  Patient report gained 120 lb within one year .  Report have been trying to lose weight, cutting her calorie and portion size, exercising daily with no positive result.  Denies eating disorder.  Denied acne, excessive body hair growth,  proximal muscle weakness, blood clot or history of fracture.  Reports symptoms of fatigue, hair loss, dry skin, polyuria and polydipsia. Reports  Headaches or visual change, reports whitish/clear nipple dischargeand irregular periods, patient report nipple piercing but no nipple rings at the time when she had her prolactin drawn.  Patient hemoglobin A1c completed today in the office and result was 5.3%.Reports her psychiatrist started her on topamax 12/2020 for mood stabilizing and to help with weight loss   Patient report tried Adipex for the past 4 month with only 20lb weight loss, report Adipex did not curp her appetite or gave her energy, report Topamax help her with weight loss.  Patient denied history of pancreatitis, family history of pancreatic cancer or MTC.    Current health problems  Patient Active Problem List    Diagnosis Date Noted   . Hidradenitis suppurativa 03/08/2021   . Smoker 03/08/2021   . Amenorrhea 01/05/2021   . Dysmetabolic syndrome X 57/01/7791   . Weight gain, abnormal 09/24/2020   . ACL tear 07/17/2019   . 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 History of positive PCR for herpes simplex virus type 2 (HSV-2) DNA 04/14/2019   . Paresthesia and pain of both upper extremities 04/09/2019    . History of bilateral tubal ligation 04/09/2019   . Night terrors, adult 04/09/2019   . History of intravenous drug use in remission 05/21/2014   . Anxiety and depression 05/21/2014         Last lipid profile and glucose:  Lab Results   Component Value Date/Time    HA1C 5.2 09/24/2020 10:10 AM    CO2 26 04/05/2021 10:09 AM    BUN 12 04/05/2021 10:09 AM    CREATININE 0.84 04/05/2021 10:09 AM    GLUCOSE Not Detected 05/31/2020 08:20 AM    TRIG 282 (H) 04/05/2021 10:09 AM    HDLCHOL 39 (L) 04/05/2021 10:09 AM    LDLCHOL 143 (H) 04/05/2021 10:09 AM    CHOLESTEROL 233 (H) 04/05/2021 10:09 AM    AST 11 04/05/2021 10:09 AM    ALT 10 04/05/2021 10:09 AM    TSH 2.366 04/05/2021 10:09 AM       Past Medical History:   Diagnosis Date   . Abnormal cervical Pap smear with positive HPV DNA test 05/21/2014   . Anxiety    . Convulsions (CMS HCC)     with IV contrast x 1   . Crohn's colitis (CMS [place])    . Depression    . Ectopic pregnancy    . Elevated liver enzymes    . Genital herpes    . Gilbert's disease    . H/O urinary tract infection  1 month ago   . History of C-section 05/21/2014   . History of intravenous drug use in remission    . History of MRSA infection 05/21/2014   . History of phlebitis 04/09/2019   . 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 Insomnia    . Oral herpes    . Prior perinatal loss, antepartum, second trimester 05/21/2014   . Sciatica    . Viral hepatitis    . Viral hepatitis C     RESOLVED         Past Surgical History:   Procedure Laterality Date   . COLONOSCOPY  2016    [place]   . DILATION AND CURETTAGE, DIAGNOSTIC / THERAPEUTIC     . ECTOPIC PREGNANCY SURGERY     . HX CESAREAN SECTION      x 2   . HX TONSILLECTOMY     . HX TUBAL LIGATION     . KNEE SURGERY Left 07/09/2019    knee arthroscopy with partial medial meniscectomy and anterior cruciate ligament reconstruction bone to bone   . LIVER BIOPSY  05/2019         busPIRone (BUSPAR) 15 mg Oral Tablet, TAKE 1 TABLET BY MOUTH THREE TIMES A DAY  (Patient taking differently: Three times a day 20 mg TID)  cloNIDine HCL (CATAPRES) 0.1 mg Oral Tablet, Take 1 Tablet (0.1 mg total) by mouth Twice daily  ergocalciferol, vitamin D2, (DRISDOL) 1,250 mcg (50,000 unit) Oral Capsule, Take 1 Capsule (50,000 Units total) by mouth Every 7 days  FLUoxetine (PROZAC) 20 mg Oral Capsule, TAKE 1 CAPSULE BY MOUTH ONCE DAILY (Patient not taking: Reported on 04/14/2021)  meloxicam (MOBIC) 15 mg Oral Tablet, Take 1 Tablet (15 mg total) by mouth Once a day for 90 days  methadone (DOLOPHINE) 5 mg/5 mL Oral Solution, Take 110 mL (110 mg total) by mouth Once a day 117m once daily  prazosin (MINIPRESS) 2 mg Oral Capsule, Take 1 Capsule (2 mg total) by mouth Twice daily Takes 4 mg    [place] manages RX  PROAIR HFA 90 mcg/actuation Inhalation oral inhaler, INHALE 2 PUFFS VIA INHALATION ROUTE EVERY 4 HOURS AS NEEDED FOR DYSPNEA  topiramate (TOPAMAX) 25 mg Oral Tablet, Take 4 Tablets (100 mg total) by mouth Once a day [employer] traZODone (DESYREL) 150 mg Oral Tablet, Take 100 mg by mouth Twice daily [employer] valACYclovir (VALTREX) 500 mg Oral Tablet, Take 1 Tablet (500 mg total) by mouth Once a day for 180 days  venlafaxine (EFFEXOR XR) 37.5 mg Oral Capsule, Sust. Release 24 hr, Take 1 Capsule (37.5 mg total) by mouth Once a day  phentermine (ADIPEX-P) 37.5 mg Oral Tablet, Take 1 Tablet (37.5 mg total) by mouth Every morning before breakfast for 60 days    No facility-administered medications prior to visit.     Family Medical History:     Problem Relation (Age of Onset)    Cancer Sister    Depression Sister    Heart Attack Father, Maternal Grandfather    Hypertension (High Blood Pressure) Mother, Father    Liver Disease Sister          Social History     Socioeconomic History   . Marital status: Legally Separated     Spouse name: Not on file   . Number of children: Not on file   . Years of education: Not on file   . Highest education  level: Not on file    Occupational History   . Not on file   Tobacco Use   . Smoking status: Every Day     Packs/day: 0.25     Types: Cigarettes   . Smokeless tobacco: Never   . Tobacco comments:     instructed not to smoke after midnight the night before surgery   Vaping Use   . Vaping Use: Some days   Substance and Sexual Activity   . Alcohol use: Not Currently   . Drug use: Not Currently     Types: Methamphetamines, Marijuana, Opioid, IV     Comment: hx of herion use---clean x 167 days   . Sexual activity: Yes     Partners: Male     Birth control/protection: Female Sterilization   Other Topics Concern   . Ability to Walk 1 Flight of Steps without SOB/CP No     Comment: "a little winded"   . Routine Exercise Not Asked   . Ability to Walk 2 Flight of Steps without SOB/CP Not Asked   . Unable to Ambulate Not Asked   . Total Care Not Asked   . Ability To Do Own ADL's Yes   . Uses Walker Not Asked   . Other Activity Level Not Asked   . Uses Cane Not Asked   Social History Narrative   . Not on file     Social Determinants of Health     Financial Resource Strain: Not on file   Transportation Needs: Not on file   Social Connections: Not on file   Intimate Partner Violence: Not on file   Housing Stability: Not on file      Immunization History   Administered Date(s) Administered   . Covid-19 Vaccine,Moderna,12 Years+ 08/09/2019, 09/06/2019   . HEPATITIS A VACCINE -ADULT 07/22/2019, 01/22/2020   . HEPATITIS B VIRUS RECOMB VACCINE 01/20/2013, 01/08/2020   . Tetanus,Diptheria,Pertussis(BOOSTRIX) 10/29/2012       ROS:   --All other systemic reviewed and are negative for pertinent findings except as mentioned in HPI/Interval history.    OBJECTIVE:     Vitals:  Vitals:    04/14/21 0913   BP: 124/74   Weight: 123 kg (270 lb 8 oz)   Height: 1.562 m (5' 1.5")   BMI: 50.39     General: pleasant, obese looking, no acute distress, conversant   Psych: appropriate affect, A&Ox3   Skin: warm, no abnormal bruising, no abnormal striae  Eyes: EOMI, PER,  non-injected conjunctiva, no proptosis or lid lag  HENT: atraumatic, oral mucosa moist and pink without rash or discoloration, nl pinna  Neck: trachea midline, neck supple   Thyroid: no thyromegaly or thyroid nodules   LNs: no cervical, submandibular, supraclavicular adenopathy  Heart: RRR, 2+ peripheral pulses, no peripheral edema   Lungs: CTAB, nl respiratory effort, no wheezes, rales, or rhonchi   Abdomen: soft, obese, non-tender, normoactive bowel sounds   Musculoskeletal: nl-looking joints, nl muscular strength  Neurological: nl patellar reflexes, no tremor, nl sensation   Last A1C  Lab Results   Component Value Date    POCTHA1C 5.3 04/14/2021       Lipid panel  Lab Results   Component Value Date    TRIG 282 (H) 04/05/2021    HDLCHOL 39 (L) 04/05/2021    LDLCHOL 143 (H) 04/05/2021    CHOLESTEROL 233 (H) 04/05/2021       Last CMP:  Recent Labs     05/31/20  0820 07/12/20  1406 04/05/21  1009   GLUCOSE Not Detected  --   --    SODIUM  --    < > 140   POTASSIUM  --    < > 4.7   CHLORIDE  --    < > 106   CO2  --    < > 26   ANIONGAP  --    < > 8   BUN  --    < > 12   CREATININE  --    < > 0.84   CALCIUM  --    < > 9.5   ALBUMIN  --    < > 3.8   TOTALPROTEIN  --    < > 6.9   ALKPHOS  --    < > 72   AST  --    < > 11   ALT  --    < > 10   TOTBILIRUBIN  --    < > 0.2*    < > = values in this interval not displayed.        Last TSH:  Lab Results   Component Value Date    TSH 2.366 04/05/2021    FREET4 1.20 01/14/2021       ASSESSMENT & PLAN:   1. Class 3 severe obesity with body mass index (BMI) of 50.0 to 59.9 in adult, unspecified obesity type, unspecified whether serious comorbidity present (CMS [facility])  Discussed the importance of Weight management. Discussed with patient that even modest weight loss of 5 lbs can help to reduce insulin resistance, Lower cholesterol and blood pressure.   Patient is advised that high carbohydrate diets (despite good glycemic control) can lead to excessive weight gain.   Reviewed the  plate method for meal preparation.Discussed decreasing/eliminating high carb/ high fat snacks.Discussed elimination of sweetened  Beverages  Patient is encouraged to increase physical activity. Recommendations are for 30 minutes a day, 5 days a week. Encouraged to start slow and increase activity gradually as tolerated.  (ex. walking for 10 minutes a day, beginning strength training exercises, or just counting foot steps and increasing steps by 10 steps a day)  Discussed weight loss medications  Discussed weight loss surgery  Patient did Adipex for 4 months and can not take longer than that due to history of IV drug use  Patient not a candidate for Contrave due to history of drug use  Taking Topamax for mood stabilization  Patient will benefit from Wegovy/Ozempic  Side effects discussed with the patient including pancreatitis and she verbalized understanding  - POCT HGB A1C  - semaglutide, weight loss, (WEGOVY) 0.25 mg/0.5 mL Subcutaneous Pen Injector; Inject 0.5 mL (0.25 mg total) under the skin Every 7 days for 30 days  Dispense: 2 mL; Refill: 0  - HGA1C (HEMOGLOBIN A1C WITH EST AVG GLUCOSE); Future  - BASIC METABOLIC PANEL, FASTING; Future  I spent over 40 minutes in face to face contact with more than 50% of time spent in counseling and coordinating care regarding obesity , weight loss, exercise, writing prescriptions and ordering labs.  2. Elevated prolactin level  Slightly elevated prolactin  Patient with nipple person  Most likely due to medications, nipple stimulation or stress  - PROLACTIN; Future  - THYROXINE, FREE (FREE T4); Future  - THYROID STIMULATING HORMONE (SENSITIVE TSH); Future  Return in about 6 months (around 10/14/2021).  Melissa Montane, MD  04/14/2021, 09:51  This note was partially created using MModal Fluency Direct system (voice recognition  software) and is inherently subject to errors including those of syntax and "sound-alike" substitutions which may escape proofreading.  In such  instances, original meaning may be extrapolated by contextual derivation.

## 2021-04-14 ENCOUNTER — Other Ambulatory Visit: Payer: Self-pay

## 2021-04-14 ENCOUNTER — Ambulatory Visit: Payer: MEDICAID | Attending: Internal Medicine | Admitting: Internal Medicine

## 2021-04-14 ENCOUNTER — Encounter (INDEPENDENT_AMBULATORY_CARE_PROVIDER_SITE_OTHER): Payer: Self-pay | Admitting: Internal Medicine

## 2021-04-14 DIAGNOSIS — Z6841 Body Mass Index (BMI) 40.0 and over, adult: Secondary | ICD-10-CM | POA: Insufficient documentation

## 2021-04-14 DIAGNOSIS — Z7182 Exercise counseling: Secondary | ICD-10-CM

## 2021-04-14 DIAGNOSIS — Z713 Dietary counseling and surveillance: Secondary | ICD-10-CM

## 2021-04-14 DIAGNOSIS — R7989 Other specified abnormal findings of blood chemistry: Secondary | ICD-10-CM

## 2021-04-14 DIAGNOSIS — E221 Hyperprolactinemia: Secondary | ICD-10-CM

## 2021-04-14 LAB — POCT HGB A1C: POCT HGB A1C: 5.3 % (ref 4–6)

## 2021-04-14 LAB — POC HEMOGLOBIN A1C (RESULTS): HEMOGLOBIN A1C, POC: 5.3 %

## 2021-04-14 MED ORDER — CEPHALEXIN 500 MG CAPSULE
500.0000 mg | ORAL_CAPSULE | Freq: Three times a day (TID) | ORAL | 0 refills | Status: DC
Start: 2021-04-14 — End: 2021-04-20

## 2021-04-14 MED ORDER — WEGOVY 0.25 MG/0.5 ML SUBCUTANEOUS PEN INJECTOR
0.2500 mg | PEN_INJECTOR | SUBCUTANEOUS | 0 refills | Status: DC
Start: 2021-04-14 — End: 2021-04-20

## 2021-04-14 NOTE — Telephone Encounter (Signed)
Yes it's down in the side of my big toe. I can hardle where socks it hurts for anything to touch it.  Shoes is definitely outta the question.  It's not leaking anything any more because I stopped messing g with it. Now it's just swollen red an really sore. Hurts to even walk an put pressure on it so I been walking like in the side of my foot. . Bout the only time i welcome my toes going numb because then I don't feel that pain so much it's just the other foot pain . But it's just my right big toe.

## 2021-04-14 NOTE — Telephone Encounter (Signed)
Sent rx and referral

## 2021-04-14 NOTE — Telephone Encounter (Signed)
Pt notified via My Chart. [name], MA  04/14/2021, 17:13

## 2021-04-14 NOTE — Addendum Note (Signed)
Addended by: Raoul Pitch on: 04/14/2021 04:32 PM     Modules accepted: Orders

## 2021-04-15 ENCOUNTER — Encounter (INDEPENDENT_AMBULATORY_CARE_PROVIDER_SITE_OTHER): Payer: Self-pay | Admitting: Family

## 2021-04-15 ENCOUNTER — Other Ambulatory Visit (INDEPENDENT_AMBULATORY_CARE_PROVIDER_SITE_OTHER): Payer: Self-pay | Admitting: Family

## 2021-04-15 DIAGNOSIS — Z8619 Personal history of other infectious and parasitic diseases: Secondary | ICD-10-CM

## 2021-04-15 MED ORDER — VALACYCLOVIR 500 MG TABLET
500.0000 mg | ORAL_TABLET | Freq: Every day | ORAL | 1 refills | Status: DC
Start: 2021-04-15 — End: 2021-08-25

## 2021-04-15 NOTE — Telephone Encounter (Addendum)
-----   Message from Trilby. Menzel sent at 04/15/2021 12:58 PM EDT -----  Regarding: Refill on a med  Contact: [phone]  Good afternoon.  [facility] saying that they was contacting my provider for a Alternative Medication? I'm not sure why?  I didn't ask them to. I don't know if there's an issuse or something?       Last scheduled appointment with you was 03/24/2021, Currently scheduled future appointment is Visit date not found,     Patient has been seen within the last year: Yes.    Confirmed preferred pharmacy for this refill encounter is   Preferred Pharmacy     [id number]   [id number]   Phone: [phone]: [phone]   Hours: Not open 24 hours      .     CNeta Mends MA  04/15/2021, 13:07

## 2021-04-19 ENCOUNTER — Encounter (INDEPENDENT_AMBULATORY_CARE_PROVIDER_SITE_OTHER): Payer: Self-pay | Admitting: Family

## 2021-04-19 ENCOUNTER — Encounter (INDEPENDENT_AMBULATORY_CARE_PROVIDER_SITE_OTHER): Payer: Self-pay

## 2021-04-20 ENCOUNTER — Encounter (INDEPENDENT_AMBULATORY_CARE_PROVIDER_SITE_OTHER): Payer: Self-pay | Admitting: Family

## 2021-04-20 ENCOUNTER — Telehealth (INDEPENDENT_AMBULATORY_CARE_PROVIDER_SITE_OTHER): Payer: Self-pay | Admitting: Internal Medicine

## 2021-04-20 ENCOUNTER — Other Ambulatory Visit: Payer: Self-pay

## 2021-04-20 ENCOUNTER — Ambulatory Visit (INDEPENDENT_AMBULATORY_CARE_PROVIDER_SITE_OTHER): Payer: MEDICAID | Admitting: Family

## 2021-04-20 ENCOUNTER — Encounter (INDEPENDENT_AMBULATORY_CARE_PROVIDER_SITE_OTHER): Payer: MEDICAID | Admitting: Family

## 2021-04-20 VITALS — BP 120/84 | HR 97 | Temp 97.0°F | Resp 16 | Ht 61.5 in | Wt 270.2 lb

## 2021-04-20 DIAGNOSIS — L600 Ingrowing nail: Secondary | ICD-10-CM

## 2021-04-20 MED ORDER — MUPIROCIN 2 % TOPICAL OINTMENT
TOPICAL_OINTMENT | Freq: Three times a day (TID) | CUTANEOUS | 0 refills | Status: AC
Start: 2021-04-20 — End: 2021-04-30

## 2021-04-20 MED ORDER — CEPHALEXIN 500 MG CAPSULE
500.0000 mg | ORAL_CAPSULE | Freq: Three times a day (TID) | ORAL | 0 refills | Status: AC
Start: 2021-04-20 — End: 2021-04-23

## 2021-04-20 NOTE — Nursing Note (Signed)
Pt is here for a infected right great toenail
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
## 2021-04-20 NOTE — Telephone Encounter (Signed)
Dr Melchor Amour I spoke with this patient regarding the denial for [facility].  She said at her last appointment you had discussed with her the possibility of weight lose surgery.  She is inquiring what are her options and what are the next steps?  Adele Schilder, LPN  2/57/5051, 83:35

## 2021-04-20 NOTE — Telephone Encounter (Signed)
Spoke with thew patient regarding the denial for [facility] and she stated she had spoken to Dr Melchor Amour that if she did not get approved for the [facility] to then apply for bariatric surgery.  Message sent to Dr Melchor Amour to contact patient or to please advise.\  Adele Schilder, LPN  3/57/8978, 47:84

## 2021-04-20 NOTE — Procedures (Signed)
PRIMARY CARE, MID- [facility]  [place] [address]    Procedure Note    Name: Amanda Levine MRN:  [id number]   Date: 04/20/2021 Age: 36 y.o.       Nail Removal  Performed by: Fidela Juneau, APRN,FNP-BC  Authorized by: Fidela Juneau, APRN,FNP-BC     Consent:     Consent obtained:  Verbal and written    Consent given by:  Patient    Risks, benefits, and alternatives were discussed: yes      Risks discussed:  Bleeding, incomplete removal, infection, pain and permanent nail deformity    Alternatives discussed:  No treatment, delayed treatment, alternative treatment, observation and referral  Universal protocol:     Procedure explained and questions answered to patient or proxy's satisfaction: yes      Site/side marked: yes      Immediately prior to procedure, a time out was called: yes      Patient identity confirmed:  Verbally with patient  Location:     Foot:  R big toe  Pre-procedure details:     Skin preparation:  Povidone-iodine and alcohol    Preparation: Patient was prepped and draped in the usual sterile fashion    Anesthesia:     Anesthesia method:  Nerve block    Block needle gauge:  25 G    Block anesthetic:  Lidocaine 1% w/o epi    Block technique:  H block    Block injection procedure:  Negative aspiration for blood, introduced needle and incremental injection    Block outcome:  Anesthesia achieved  Ingrown nail:     Wedge excision of skin: yes      Nail matrix removed or ablated:  None  Post-procedure details:     Dressing:  Antibiotic ointment and 4x4 sterile gauze    Procedure completion:  Tolerated well, no immediate complications        Fidela Juneau, APRN,FNP-BC

## 2021-04-21 ENCOUNTER — Encounter (INDEPENDENT_AMBULATORY_CARE_PROVIDER_SITE_OTHER): Payer: Self-pay

## 2021-04-22 ENCOUNTER — Telehealth (INDEPENDENT_AMBULATORY_CARE_PROVIDER_SITE_OTHER): Payer: Self-pay | Admitting: Family

## 2021-04-22 ENCOUNTER — Other Ambulatory Visit (INDEPENDENT_AMBULATORY_CARE_PROVIDER_SITE_OTHER): Payer: Self-pay | Admitting: Internal Medicine

## 2021-04-22 NOTE — Telephone Encounter (Signed)
Pharmacy is requesting a alternative to Valacyclovir.  Mx refills have been met on Medicaid.

## 2021-04-25 ENCOUNTER — Other Ambulatory Visit (INDEPENDENT_AMBULATORY_CARE_PROVIDER_SITE_OTHER): Payer: Self-pay | Admitting: Internal Medicine

## 2021-04-25 ENCOUNTER — Encounter (INDEPENDENT_AMBULATORY_CARE_PROVIDER_SITE_OTHER): Payer: Self-pay

## 2021-04-25 ENCOUNTER — Encounter (INDEPENDENT_AMBULATORY_CARE_PROVIDER_SITE_OTHER): Payer: Self-pay | Admitting: Family

## 2021-04-25 ENCOUNTER — Telehealth (INDEPENDENT_AMBULATORY_CARE_PROVIDER_SITE_OTHER): Payer: Self-pay | Admitting: Family

## 2021-04-25 MED ORDER — ACYCLOVIR 400 MG TABLET
400.0000 mg | ORAL_TABLET | Freq: Two times a day (BID) | ORAL | 3 refills | Status: DC
Start: 2021-04-25 — End: 2022-02-08

## 2021-04-25 NOTE — Telephone Encounter (Signed)
-----   Message from [place]. Fick sent at 04/25/2021  3:22 PM EDT -----  Regarding: Unknown med change   Contact: [phone]  Good afternoon. I went to call in my valacyclovir to get them refilled a few mins ago an the pharmacy told me that they couldn't fill them because theres somethingsimilarto it that was called in today. My questions are 1.) Why the sudden change in the Medication  when I been on the Valacylovir since 2021 2.) Why didn't any one call or message me to let me know anything about it.  I had no clue it was even called in. The pharmacy doesn't always allert people when they have meds to pick up an when I called to to fill the med I always get she tells me I can't because of this other one that was called in today an somethin about insurance an not covering both. Im just like what are you talking about lady? I have no clue about any of that I don't know the name of it or what it's for or anything. An she just tells me it was called in today by [facility] my doctor. Could you explain it a little better to me please? An thank you

## 2021-04-25 NOTE — Telephone Encounter (Signed)
Apolonio Schneiders notified this office on multiple occasions stating that the pharmacy would not fill her Valacyclovir and she would need an alternative medication (last 04/15/21).  On Friday, the pharmacy notified us that she had reached her max refills on Valacyclovir and would need an alternative medication.  I responded to this pharmacy notification today and refilled the alternative medication (Acyclovir).  We just filled what the pharmacy requested of [place].     If she has concerns she should discuss further with the pharmacy.

## 2021-04-26 NOTE — Telephone Encounter (Signed)
Pt notified via My Chart. [name], MA  04/26/2021, 09:03

## 2021-04-28 ENCOUNTER — Other Ambulatory Visit: Payer: Self-pay

## 2021-04-28 ENCOUNTER — Ambulatory Visit (INDEPENDENT_AMBULATORY_CARE_PROVIDER_SITE_OTHER): Payer: MEDICAID | Admitting: Internal Medicine

## 2021-04-28 DIAGNOSIS — R202 Paresthesia of skin: Secondary | ICD-10-CM

## 2021-04-28 DIAGNOSIS — R200 Anesthesia of skin: Secondary | ICD-10-CM

## 2021-04-28 NOTE — Procedures (Signed)
[facility]  [place]  [place] [address]    Procedure Note    Name: Nayda Riesen MRN:  [id number]   Date: 04/28/2021 Age: 36 y.o.       Generic Procedure  Performed by: Scot Dock, MD  Authorized by: Scot Dock, MD     Documentation:      Chief complaint:  36 year old female experiencing tingling, numbness, and sometimes sharp pains in the feet    Findings:  Bilateral peroneal, tibial, and sural nerve conduction studies are normal.  The EMG examination of both lower extremities is normal    Interpretation:  Normal EMG and nerve conduction study of both legs        Scot Dock, MD

## 2021-05-02 ENCOUNTER — Encounter (INDEPENDENT_AMBULATORY_CARE_PROVIDER_SITE_OTHER): Payer: Self-pay | Admitting: Family

## 2021-05-02 ENCOUNTER — Telehealth (INDEPENDENT_AMBULATORY_CARE_PROVIDER_SITE_OTHER): Payer: Self-pay | Admitting: Family

## 2021-05-02 NOTE — Telephone Encounter (Signed)
-----   Message from [place]. [place] sent at 05/02/2021  1:37 PM EDT -----  Regarding: About Neurology appt  Contact: [phone]  I was just wondering what steps do we take bow or are there even any steps to take from here. Since the results from the neurologist  was normal an you"ve/ the doctor (I never know if I'm talking to the nurse or the doctor so I'm not sure how to address the message) but I've already been tested for diabetes as well an that to came back as normal. I know you/Jessica asked me about my lower back that day I was in there an if I had ever had an MRI of it an I said no. That was something else the nurses said at Dr. Corena Pilgrim office aswell they said it could be a number of things an I made the comment about what my doctor has already tryed so far an those have ended bein good an she said maybe the back area to. They said they was going to send all that stuff over to your office.

## 2021-05-02 NOTE — Telephone Encounter (Signed)
-----   Message from [name], [place] sent at 05/02/2021  2:10 PM EDT -----  Regarding: FW: Unknown med change   Contact: [phone]    ----- Message -----  From: [name]  Sent: 05/02/2021   1:23 PM EDT  To: [name], MA  Subject: Unknown med change                               I did discuss this with the pharmacy! And the day that the pharmacy had sent that in to the doctor I had messaged and let u all know that [facility] had sent me a message stating that I was up on my amount of refills so they sent in a request for a alternative med. Also that same day the nurse called me in a new refill for the Valtrex.  Ever since I have gotten the Valtrex I've only gotten a quantity of 7 with like 50 some refills. Well after a few refills I don't know why but the type of insurance I have it don't matter if I still have refills left or not I have always still had to contact the doctor an have had her call in a new script. According to the pharmacy they was seeing if there was a med that had more in it so we wouldn't keep running in to this situation cause like I said I only got 7 pills. So every 6/7 days I'm refilling this med which isn't the issue but I can only do that  maybe 10 times if that then I need a new script called in even tho I have refills left. Or atleast   that's how they explained it. So the day the pharmacy sent in for a alternative med how come this new one wasn't called in then? Instead The Valtrex was. The Acyclovir wasn't called in till 04/25/21? If they sent in something about it on 04/15/21 an then Valtrex was still then called in on 04/15/21 then on 04/25/21  this Acyclovir was an Noone had called or notified me saying anything. I mean Do u see my confusion? Aslong as it does the same an helps i'm good .

## 2021-05-02 NOTE — Telephone Encounter (Signed)
I cant really sort this out.     There are two ways to take these meds -- 7 days for acute flares or daily for suppression.      You can use Valacyclovir or Acyclovir. They are interchangeable.

## 2021-05-02 NOTE — Nursing Note (Signed)
Pt notified of below via My Chart. [name], MA  05/02/2021, 14:46

## 2021-05-03 ENCOUNTER — Telehealth (INDEPENDENT_AMBULATORY_CARE_PROVIDER_SITE_OTHER): Payer: Self-pay | Admitting: Family

## 2021-05-03 ENCOUNTER — Encounter (INDEPENDENT_AMBULATORY_CARE_PROVIDER_SITE_OTHER): Payer: Self-pay | Admitting: Family

## 2021-05-03 NOTE — Telephone Encounter (Signed)
I dont have any acute openings for procedures the next few days.  Recommend urgent care.

## 2021-05-03 NOTE — Telephone Encounter (Signed)
-----   Message from [place]. [place] sent at 05/03/2021  6:02 AM EDT -----  Regarding: Toe nails bleeding  Contact: [phone]  The big toe that the doctor helped relieve some of that pressure out of on my right foot. I was wondering if there was any way at all she could please see me today an do what she did the last time on the right side of my toe nail. I have stuff comin out around the side of my nail I can't put any kinda bandage on cuz I can't touch it. It hurts clear down my whole big toe bone. It takes every thing i got to walk an not either buckle from the pain or pass out from the pain. I can't even wear a sock I can't lay in bed every way I lay it hits the covers an. It's beyond painful. Podiatry just finally called yesterday but can't get me in till the end of the month.  I really can't go that long I can't handle this pain it almost makes me pass out.

## 2021-05-03 NOTE — Telephone Encounter (Signed)
Pt notified of below via My Chart. [name], MA  05/03/2021, 11:26

## 2021-05-05 ENCOUNTER — Telehealth (INDEPENDENT_AMBULATORY_CARE_PROVIDER_SITE_OTHER): Payer: Self-pay | Admitting: Family

## 2021-05-05 NOTE — Telephone Encounter (Signed)
Yes she can just get a quick letter than says its ok. Its controlled.

## 2021-05-05 NOTE — Telephone Encounter (Signed)
-----   Message from [place]. Cabal sent at 05/02/2021  1:37 PM EDT -----  Regarding: About Neurology appt  Contact: [phone]  I was just wondering what steps do we take bow or are there even any steps to take from here. Since the results from the neurologist  was normal an you"ve/ the doctor (I never know if I'm talking to the nurse or the doctor so I'm not sure how to address the message) but I've already been tested for diabetes as well an that to came back as normal. I know you/Jessica asked me about my lower back that day I was in there an if I had ever had an MRI of it an I said no. That was something else the nurses said at Dr. Corena Pilgrim office aswell they said it could be a number of things an I made the comment about what my doctor has already tryed so far an those have ended bein good an she said maybe the back area to. They said they was going to send all that stuff over to your office.

## 2021-05-05 NOTE — Telephone Encounter (Signed)
EMG shows no neuropathy in the legs.  CT of lumbar spine in 2022 was normal.   Podiatry referral was initially for her feet pain and then she developed the ingrown nail.  They can address both problems with her.     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

## 2021-05-05 NOTE — Telephone Encounter (Signed)
Pt notified of below Via My Chart. [name], MA  05/05/2021, 11:58

## 2021-05-05 NOTE — Telephone Encounter (Signed)
Amanda Levine had mentioned something about maybe doing an MRI at one time bc the CT scan I had in 2021 was of my left knee when I had my ACL replaced it wasn't of my back. And as for that one med messing with my methadone will I just need to get a letter from Dr. Owens Shark ( my Dr. At the treatment center) stating that it is okay for me to take that med along with my methadone? Then would I just bring that paper in to the office an drop it off of do I need a fax number for u all

## 2021-05-09 NOTE — Telephone Encounter (Signed)
Pt notified via My Chart. [name], MA  05/09/2021, 11:06

## 2021-05-10 ENCOUNTER — Encounter (INDEPENDENT_AMBULATORY_CARE_PROVIDER_SITE_OTHER): Payer: Self-pay | Admitting: Family

## 2021-05-11 ENCOUNTER — Other Ambulatory Visit (INDEPENDENT_AMBULATORY_CARE_PROVIDER_SITE_OTHER): Payer: Self-pay | Admitting: Family

## 2021-05-11 DIAGNOSIS — M79601 Pain in right arm: Secondary | ICD-10-CM

## 2021-05-11 MED ORDER — GABAPENTIN 300 MG CAPSULE
300.0000 mg | ORAL_CAPSULE | Freq: Two times a day (BID) | ORAL | 3 refills | Status: DC
Start: 2021-05-11 — End: 2021-08-23

## 2021-05-11 NOTE — Telephone Encounter (Signed)
-----   Message from [place]. [place] sent at 05/10/2021  1:35 PM EDT -----  Regarding: Note from methadone clinic   Contact: [phone]  Your welcome.  Thank you for letting me know. So is there anything else I need to do on my end?  Or just wait for the doc to call in the meds?

## 2021-07-07 ENCOUNTER — Other Ambulatory Visit (INDEPENDENT_AMBULATORY_CARE_PROVIDER_SITE_OTHER): Payer: Self-pay | Admitting: Family

## 2021-07-07 DIAGNOSIS — M545 Low back pain, unspecified: Secondary | ICD-10-CM

## 2021-07-07 DIAGNOSIS — M1712 Unilateral primary osteoarthritis, left knee: Secondary | ICD-10-CM

## 2021-07-07 NOTE — Telephone Encounter (Signed)
Last scheduled appointment with you was 04/20/2021, Currently scheduled future appointment is Visit date not found,     Patient has been seen within the last year: Yes.    Confirmed preferred pharmacy for this refill encounter is   Preferred Pharmacy     [id number]   [address]   Phone: [phone]: [phone]   Hours: Not open 24 hours      .     CNeta Mends MA  07/07/2021, 08:03

## 2021-07-18 ENCOUNTER — Encounter (INDEPENDENT_AMBULATORY_CARE_PROVIDER_SITE_OTHER): Payer: Self-pay | Admitting: Family

## 2021-07-18 ENCOUNTER — Telehealth (INDEPENDENT_AMBULATORY_CARE_PROVIDER_SITE_OTHER): Payer: Self-pay | Admitting: Family

## 2021-07-18 ENCOUNTER — Encounter (INDEPENDENT_AMBULATORY_CARE_PROVIDER_SITE_OTHER): Payer: Self-pay | Admitting: Internal Medicine

## 2021-07-20 ENCOUNTER — Encounter (INDEPENDENT_AMBULATORY_CARE_PROVIDER_SITE_OTHER): Payer: MEDICAID | Admitting: Family

## 2021-08-23 ENCOUNTER — Encounter (INDEPENDENT_AMBULATORY_CARE_PROVIDER_SITE_OTHER): Payer: Self-pay | Admitting: Family

## 2021-08-23 ENCOUNTER — Other Ambulatory Visit: Payer: Self-pay

## 2021-08-23 ENCOUNTER — Ambulatory Visit (INDEPENDENT_AMBULATORY_CARE_PROVIDER_SITE_OTHER): Payer: MEDICAID | Admitting: Family

## 2021-08-23 VITALS — BP 126/64 | HR 92 | Temp 97.6°F | Resp 18 | Ht 61.5 in | Wt 266.0 lb

## 2021-08-23 DIAGNOSIS — M79672 Pain in left foot: Secondary | ICD-10-CM

## 2021-08-23 DIAGNOSIS — Z1331 Encounter for screening for depression: Secondary | ICD-10-CM

## 2021-08-23 DIAGNOSIS — M79602 Pain in left arm: Secondary | ICD-10-CM

## 2021-08-23 DIAGNOSIS — R202 Paresthesia of skin: Secondary | ICD-10-CM

## 2021-08-23 DIAGNOSIS — M79671 Pain in right foot: Secondary | ICD-10-CM

## 2021-08-23 DIAGNOSIS — R200 Anesthesia of skin: Secondary | ICD-10-CM

## 2021-08-23 MED ORDER — GABAPENTIN 300 MG CAPSULE
600.0000 mg | ORAL_CAPSULE | Freq: Two times a day (BID) | ORAL | 2 refills | Status: DC
Start: 2021-08-23 — End: 2021-12-06

## 2021-08-23 NOTE — Nursing Note (Signed)
Pt reports increased leg pain due to being on her feet more often with her new job. Pt is requesting a increase in her Gabapentin.

## 2021-08-23 NOTE — Progress Notes (Signed)
PRIMARY CARE, MID- [place] [address]     Name: Amanda Levine MRN:  [id number]   Date: 08/23/2021 Age: 36 y.o.     PCP: Fidela Juneau, APRN,FNP-BC     Reason for Visit: Leg Pain    History of Present Illness  Amanda Levine is a 36 y.o. female who is being seen today for foot pain.     Problem   Numbness and Tingling of Both Feet    Numbness and tingling in bilateral feet.   Working a new job where she is on her feet more often and reports pain is worse.   04/28/21 EMG Interpretation:  Normal EMG and nerve conduction study of both legs   Did follow with Podiatry and they offered patient insoles.   Neurontin did help but she feels like it wears off in the afternoon     Smoker (Resolved)    0.25 ppd             Past Medical History:   Diagnosis Date    Abnormal cervical Pap smear with positive HPV DNA test 05/21/2014    Anxiety     Convulsions (CMS HCC)     with IV contrast x 1    Crohn's colitis (CMS HCC)     Depression     Ectopic pregnancy     Elevated liver enzymes     Genital herpes     Gilbert's disease     H/O urinary tract infection     1 month ago    History of C-section 05/21/2014    History of intravenous drug use in remission     History of MRSA infection 05/21/2014    History of phlebitis 04/09/2019    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    Insomnia     Oral herpes     Prior perinatal loss, antepartum, second trimester 05/21/2014    Sciatica     Viral hepatitis     Viral hepatitis C     RESOLVED         Past Surgical History:   Procedure Laterality Date    COLONOSCOPY  2016    [place]    DILATION AND CURETTAGE, DIAGNOSTIC / THERAPEUTIC      ECTOPIC PREGNANCY SURGERY      HX CESAREAN SECTION      x 2    HX TONSILLECTOMY      HX TUBAL LIGATION      KNEE SURGERY Left 07/09/2019    knee arthroscopy with partial medial meniscectomy and anterior cruciate ligament reconstruction bone to bone    LIVER BIOPSY  05/2019         Medication:  acyclovir (ZOVIRAX)  400 mg Oral Tablet, Take 1 Tablet (400 mg total) by mouth Twice daily  busPIRone (BUSPAR) 30 mg Oral Tablet, Take 1 Tablet (30 mg total) by mouth Twice daily  cloNIDine HCL (CATAPRES) 0.1 mg Oral Tablet, Take 1 Tablet (0.1 mg total) by mouth Twice daily  DULoxetine (CYMBALTA DR) 30 mg Oral Capsule, Delayed Release(E.C.), Take 1 Capsule (30 mg total) by mouth Once a day  ergocalciferol, vitamin D2, (DRISDOL) 1,250 mcg (50,000 unit) Oral Capsule, Take 1 Capsule (50,000 Units total) by mouth Every 7 days  meloxicam (MOBIC) 15 mg Oral Tablet, TAKE 1 TABLET (15 MG TOTAL) BY MOUTH ONCE A DAY FOR 90 DAYS  methadone (DOLOPHINE) 5 mg/5 mL Oral  Solution, Take 140 mL (140 mg total) by mouth Once a day 140 mg once daily  PROAIR HFA 90 mcg/actuation Inhalation oral inhaler, INHALE 2 PUFFS VIA INHALATION ROUTE EVERY 4 HOURS AS NEEDED FOR DYSPNEA  propranoloL (INDERAL) 10 mg Oral Tablet, Take 1 Tablet (10 mg total) by mouth Once a day  topiramate (TOPAMAX) 25 mg Oral Tablet, Take 4 Tablets (100 mg total) by mouth Once a day [employer]  traZODone (DESYREL) 150 mg Oral Tablet, Take 100 mg by mouth Twice daily [employer]  valACYclovir (VALTREX) 500 mg Oral Tablet, Take 1 Tablet (500 mg total) by mouth Once a day for 180 days  venlafaxine (EFFEXOR XR) 37.5 mg Oral Capsule, Sust. Release 24 hr, Take 1 Capsule (37.5 mg total) by mouth Once a day  busPIRone (BUSPAR) 15 mg Oral Tablet, TAKE 1 TABLET BY MOUTH THREE TIMES A DAY (Patient taking differently: Three times a day 20 mg TID)  gabapentin (NEURONTIN) 300 mg Oral Capsule, Take 1 Capsule (300 mg total) by mouth Twice daily    No facility-administered medications prior to visit.    Allergies:  Allergies   Allergen Reactions    Penicillin G Rash    Shellfish Derived Rash    Amoxicillin     Iv Contrast Seizure    Seafood [Shrimp]        Family Medical History:       Problem Relation (Age of Onset)    Cancer Sister    Depression Sister    Heart Attack Father,  Maternal Grandfather    Hypertension (High Blood Pressure) Mother, Father    Liver Disease Sister            Social History     Tobacco Use    Smoking status: Former     Packs/day: 0.25     Types: Cigarettes     Quit date: 03/08/2021     Years since quitting: 0.4    Smokeless tobacco: Never    Tobacco comments:     instructed not to smoke after midnight the night before surgery   Vaping Use    Vaping Use: Some days   Substance Use Topics    Alcohol use: Not Currently    Drug use: Not Currently     Types: Methamphetamines, Marijuana, Opioid, IV     Comment: hx of herion use---clean x 167 days       Review of Systems  Pertinent positives listed in HPI    Nursing Notes:   [name], [place]  08/23/21 1513  Signed     08/23/21 1512   Recent Weight Change   Have you had a recent unexplained weight loss or gain? Y   Health Literacy   How often do you have a problem understanding what is told to you about your medical condition?  Occasionally   Domestic Violence   Because we are aware of abuse and domestic violence today, we ask all patients: Are you being hurt, hit, or frightened by anyone at your home or in your life?  N   Basic Needs   Do you have any basic needs within your home that are not being met? (such as Food, Shelter, Games developer, Tranportation, paying for bills and/or medications) N   Advanced Directives   Do you have any advanced directives? No Advance   Would you like an advanced directive packet? [address]         Tamera Punt [place], [place]  08/23/21 1514  Signed  08/23/21 1513   PHQ 9 (follow up)   Little interest or pleasure in doing things. 0   Feeling down, depressed, or hopeless 2   Trouble falling or staying asleep, or sleeping too much. 0   Feeling tired or having little energy 3   Poor appetite or overeating 2   Feeling bad about yourself/ that you are a failure in the past 2 weeks? 2   Trouble concentrating on things in the past 2 weeks? 2   Moving/Speaking slowly or being fidgety or restless   in the past 2 weeks? 0   Thoughts that you would be better off DEAD, or of hurting yourself in some way. 0   If you checked off any problems, how difficult have these problems made it for you to do your work, take care of things at home, or get along with other people? Somewhat difficult   PHQ 9 Total 11   Interpretation of Total Score Moderate depression         [name], [place]  08/23/21 1519  Signed  Pt reports increased leg pain due to being on her feet more often with her new job. Pt is requesting a increase in her Gabapentin.   Physical Exam:  Vitals:    08/23/21 1515   BP: 126/64   Pulse: 92   Resp: 18   Temp: 36.4 C (97.6 F)   TempSrc: Tympanic   SpO2: 96%   Weight: 121 kg (266 lb)   Height: 1.562 m (5' 1.5")   BMI: 49.55      Physical Exam  Vitals and nursing note reviewed.   Constitutional:       Appearance: She is obese.   Cardiovascular:      Pulses:           Dorsalis pedis pulses are 2+ on the right side and 2+ on the left side.   Musculoskeletal:      Right lower leg: No edema.      Left lower leg: No edema.   Skin:     General: Skin is warm.   Neurological:      Mental Status: She is oriented to person, place, and time.   Psychiatric:         Mood and Affect: Mood normal.         Behavior: Behavior normal.       Assessment/Plan:  Problem List Items Addressed This Visit          Unprioritized    Paresthesia and pain of both upper extremities - Primary (Chronic)    Numbness and tingling of both feet (Chronic)     Chronic, Uncontrolled Problem  Increase Neurontin  Unable to take a mid-day dose due to her work schedule  [facility] was last reviewed on 08/23/2021  3:25 PM by Fidela Juneau and result was REVIEWED PDMP.   Recommend increased aerobic exercise and weight loss.          Relevant Medications    gabapentin (NEURONTIN) 300 mg Oral Capsule       Follow up: Return in about 3 months (around 11/23/2021).  Seek medical attention for new or worsening symptoms.  Patient has been seen in this  clinic within the last 3 years.     Fidela Juneau, APRN,FNP-BC      This note was partially created using MModal Fluency Direct system (voice recognition software) and is inherently subject to errors including those of syntax and "sound-alike" substitutions which may escape proofreading.  In such instances, original meaning may be extrapolated by contextual derivation.

## 2021-08-23 NOTE — Nursing Note (Signed)
08/23/21 1512   Recent Weight Change   Have you had a recent unexplained weight loss or gain? Y   Health Literacy   How often do you have a problem understanding what is told to you about your medical condition?  Occasionally   Domestic Violence   Because we are aware of abuse and domestic violence today, we ask all patients: Are you being hurt, hit, or frightened by anyone at your home or in your life?  N   Basic Needs   Do you have any basic needs within your home that are not being met? (such as Food, Shelter, Games developer, Tranportation, paying for bills and/or medications) N   Advanced Directives   Do you have any advanced directives? No Advance   Would you like an advanced directive packet? Refused Packet

## 2021-08-23 NOTE — Assessment & Plan Note (Addendum)
Chronic, Uncontrolled Problem  Increase Neurontin  Unable to take a mid-day dose due to her work schedule  Karleen Dolphin was last reviewed on 08/23/2021  3:25 PM by Fidela Juneau and result was REVIEWED PDMP.   Recommend increased aerobic exercise and weight loss.

## 2021-08-23 NOTE — Nursing Note (Signed)
08/23/21 1513   PHQ 9 (follow up)   Little interest or pleasure in doing things. 0   Feeling down, depressed, or hopeless 2   Trouble falling or staying asleep, or sleeping too much. 0   Feeling tired or having little energy 3   Poor appetite or overeating 2   Feeling bad about yourself/ that you are a failure in the past 2 weeks? 2   Trouble concentrating on things in the past 2 weeks? 2   Moving/Speaking slowly or being fidgety or restless  in the past 2 weeks? 0   Thoughts that you would be better off DEAD, or of hurting yourself in some way. 0   If you checked off any problems, how difficult have these problems made it for you to do your work, take care of things at home, or get along with other people? Somewhat difficult   PHQ 9 Total 11   Interpretation of Total Score Moderate depression

## 2021-09-30 ENCOUNTER — Encounter (INDEPENDENT_AMBULATORY_CARE_PROVIDER_SITE_OTHER): Payer: Self-pay | Admitting: Family

## 2021-09-30 ENCOUNTER — Other Ambulatory Visit (INDEPENDENT_AMBULATORY_CARE_PROVIDER_SITE_OTHER): Payer: Self-pay | Admitting: Family

## 2021-09-30 DIAGNOSIS — G8929 Other chronic pain: Secondary | ICD-10-CM

## 2021-09-30 DIAGNOSIS — M1712 Unilateral primary osteoarthritis, left knee: Secondary | ICD-10-CM

## 2021-09-30 MED ORDER — MELOXICAM 15 MG TABLET
15.0000 mg | ORAL_TABLET | Freq: Every day | ORAL | 0 refills | Status: AC
Start: 2021-09-30 — End: 2021-12-29

## 2021-09-30 NOTE — Telephone Encounter (Addendum)
-----   Message from [place]. Dede sent at 09/30/2021  9:34 AM EDT -----  Regarding: Med refill  Contact: [phone]  I was wondering if I could get a refill in my mobic 86m I don't have any refills left . Please and thank you.      Last scheduled appointment with you was 08/23/21, Currently scheduled future appointment is Visit date not found,     Patient has been seen within the last year: Yes.    Confirmed preferred pharmacy for this refill encounter is   Preferred Pharmacy       [id number] [place]  [address]  Phone: [phone]: [phone]  Hours: Not open 24 hours        .     ChNeta MendsMA  09/30/2021, 10:13

## 2021-10-13 NOTE — Progress Notes (Unsigned)
Endocrinology, [facility]  [address]  [place]  [address]  [phone]    Date: 10/14/2021  Name: Amanda Levine  Age: 36 y.o.    Chief Complaint: No chief complaint on file.    HPI    Amanda Levine is a 36 y.o. female  with history of hep C, IV drug use,Crohn's disease , anxiety/depression ,PTSD , mood disorder  who presents for follow-up for obesity and hyperprolactinemia.  Patient report gained 120 lb within one year .  Report have been trying to lose weight, cutting her calorie and portion size, exercising daily with no positive result.  Denies eating disorder.  Denied acne, excessive body hair growth,  proximal muscle weakness, blood clot or history of fracture.  Reports symptoms of fatigue, hair loss, dry skin, polyuria and polydipsia. Reports  Headaches or visual change, reports whitish/clear nipple dischargeand irregular periods, patient report nipple piercing but no nipple rings at the time when she had her prolactin drawn.  Patient hemoglobin A1c completed today in the office and result was 5.3%.  -Follow OE:VOJJKKX her psychiatrist started her on topamax 12/2020 for mood stabilizing and to help with weight loss   Patient report tried Adipex for the past 4 month with only 20lb weight loss, report Adipex did not curp her appetite or gave her energy, report Topamax help her with weight loss.  Patient denied history of pancreatitis, family history of pancreatic cancer or MTC.    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 04/14/2019    History of positive PCR for herpes simplex virus type 2 (HSV-2) DNA 04/14/2019    Paresthesia and pain of both upper extremities  04/09/2019    History of bilateral tubal ligation 04/09/2019    Night terrors, adult 04/09/2019    History of intravenous drug use in remission 05/21/2014    Anxiety and depression 05/21/2014         Last lipid profile and glucose:  Lab Results   Component Value Date/Time    HA1C 5.2 09/24/2020 10:10 AM    CO2 26 04/05/2021 10:09 AM    BUN 12 04/05/2021 10:09 AM    CREATININE 0.84 04/05/2021 10:09 AM    GLUCOSE Not Detected 05/31/2020 08:20 AM    TRIG 282 (H) 04/05/2021 10:09 AM    HDLCHOL 39 (L) 04/05/2021 10:09 AM    LDLCHOL 143 (H) 04/05/2021 10:09 AM    CHOLESTEROL 233 (H) 04/05/2021 10:09 AM    AST 11 04/05/2021 10:09 AM    ALT 10 04/05/2021 10:09 AM    TSH 2.366 04/05/2021 10:09 AM       Past Medical History:   Diagnosis Date    Abnormal cervical Pap smear with positive HPV DNA test 05/21/2014    Anxiety     Convulsions (CMS [facility])     with IV contrast x 1    Crohn's colitis (CMS HCC)     Depression     Ectopic pregnancy     Elevated liver enzymes     Genital herpes     Gilbert's disease     H/O urinary tract infection  1 month ago    History of C-section 05/21/2014    History of intravenous drug use in remission     History of MRSA infection 05/21/2014    History of phlebitis 04/09/2019    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    Insomnia     Oral herpes     Prior perinatal loss, antepartum, second trimester 05/21/2014    Sciatica     Viral hepatitis     Viral hepatitis C     RESOLVED         Past Surgical History:   Procedure Laterality Date    COLONOSCOPY  2016    [place]    DILATION AND CURETTAGE, DIAGNOSTIC / THERAPEUTIC      ECTOPIC PREGNANCY SURGERY      HX CESAREAN SECTION      x 2    HX TONSILLECTOMY      HX TUBAL LIGATION      KNEE SURGERY Left 07/09/2019    knee arthroscopy with partial medial meniscectomy and anterior cruciate ligament reconstruction bone to bone    LIVER BIOPSY  05/2019         acyclovir (ZOVIRAX) 400 mg Oral Tablet, Take 1 Tablet (400 mg total) by mouth Twice daily  busPIRone  (BUSPAR) 30 mg Oral Tablet, Take 1 Tablet (30 mg total) by mouth Twice daily  cloNIDine HCL (CATAPRES) 0.1 mg Oral Tablet, Take 1 Tablet (0.1 mg total) by mouth Twice daily  DULoxetine (CYMBALTA DR) 30 mg Oral Capsule, Delayed Release(E.C.), Take 1 Capsule (30 mg total) by mouth Once a day  gabapentin (NEURONTIN) 300 mg Oral Capsule, Take 2 Capsules (600 mg total) by mouth Twice daily for 90 days  meloxicam (MOBIC) 15 mg Oral Tablet, Take 1 Tablet (15 mg total) by mouth Once a day for 90 days  methadone (DOLOPHINE) 5 mg/5 mL Oral Solution, Take 140 mL (140 mg total) by mouth Once a day 140 mg once daily  PROAIR HFA 90 mcg/actuation Inhalation oral inhaler, INHALE 2 PUFFS VIA INHALATION ROUTE EVERY 4 HOURS AS NEEDED FOR DYSPNEA  propranoloL (INDERAL) 10 mg Oral Tablet, Take 1 Tablet (10 mg total) by mouth Once a day  topiramate (TOPAMAX) 25 mg Oral Tablet, Take 4 Tablets (100 mg total) by mouth Once a day [employer]  traZODone (DESYREL) 150 mg Oral Tablet, Take 100 mg by mouth Twice daily [facility]  venlafaxine (EFFEXOR XR) 37.5 mg Oral Capsule, Sust. Release 24 hr, Take 1 Capsule (37.5 mg total) by mouth Once a day    No facility-administered medications prior to visit.     Family Medical History:       Problem Relation (Age of Onset)    Cancer Sister    Depression Sister    Heart Attack Father, Maternal Grandfather    Hypertension (High Blood Pressure) Mother, Father    Liver Disease Sister            Social History     Socioeconomic History    Marital status: Legally Separated     Spouse name: Not on file    Number of children: Not on file    Years of education: Not on file    Highest education level: Not on file   Occupational History    Not on file   Tobacco Use    Smoking status: Former     Packs/day: .25     Types: Cigarettes  Quit date: 03/08/2021     Years since quitting: 0.6    Smokeless tobacco: Never    Tobacco comments:     instructed not to smoke after midnight the night before  surgery   Vaping Use    Vaping Use: Some days   Substance and Sexual Activity    Alcohol use: Not Currently    Drug use: Not Currently     Types: Methamphetamines, Marijuana, Opioid, IV     Comment: hx of herion use---clean x 167 days    Sexual activity: Yes     Partners: Male     Birth control/protection: Female Sterilization   Other Topics Concern    Ability to Walk 1 Flight of Steps without SOB/CP No     Comment: "a little winded"    Routine Exercise Not Asked    Ability to Walk 2 Flight of Steps without SOB/CP Not Asked    Unable to Ambulate Not Asked    Total Care Not Asked    Ability To Do Own ADL's Yes    Uses Walker Not Asked    Other Activity Level Not Asked    Uses Cane Not Asked   Social History Narrative    Not on file     Social Determinants of Health     Financial Resource Strain: Not on file   Transportation Needs: Not on file   Social Connections: Not on file   Intimate Partner Violence: Not on file   Housing Stability: Not on file      Immunization History   Administered Date(s) Administered    Covid-19 Vaccine,Moderna,12 Years+ 08/09/2019, 09/06/2019    HEPATITIS A VACCINE -ADULT 07/22/2019, 01/22/2020    HEPATITIS B VIRUS RECOMB VACCINE 01/20/2013, 01/08/2020    Tetanus,Diptheria,Pertussis(BOOSTRIX) 10/29/2012       ROS:   --All other systemic reviewed and are negative for pertinent findings except as mentioned in HPI/Interval history.    OBJECTIVE:     Vitals:  There were no vitals filed for this visit.    General: pleasant, obese looking, no acute distress, conversant   Psych: appropriate affect, A&Ox3   Skin: warm, no abnormal bruising, no abnormal striae  Eyes: EOMI, PER, non-injected conjunctiva, no proptosis or lid lag  HENT: atraumatic, oral mucosa moist and pink without rash or discoloration, nl pinna  Neck: trachea midline, neck supple   Thyroid: no thyromegaly or thyroid nodules   LNs: no cervical, submandibular, supraclavicular adenopathy  Heart: RRR, 2+ peripheral pulses, no peripheral  edema   Lungs: CTAB, nl respiratory effort, no wheezes, rales, or rhonchi   Abdomen: soft, obese, non-tender, normoactive bowel sounds   Musculoskeletal: nl-looking joints, nl muscular strength  Neurological: nl patellar reflexes, no tremor, nl sensation   Last A1C  Lab Results   Component Value Date    POCTHA1C 5.3 04/14/2021       Lipid panel  Lab Results   Component Value Date    TRIG 282 (H) 04/05/2021    HDLCHOL 39 (L) 04/05/2021    LDLCHOL 143 (H) 04/05/2021    CHOLESTEROL 233 (H) 04/05/2021       Last CMP:  Recent Labs     04/05/21  1009   SODIUM 140   POTASSIUM 4.7   CHLORIDE 106   CO2 26   ANIONGAP 8   BUN 12   CREATININE 0.84   CALCIUM 9.5   ALBUMIN 3.8   TOTALPROTEIN 6.9   ALKPHOS 72   AST 11   ALT  10   TOTBILIRUBIN 0.2*        Last TSH:  Lab Results   Component Value Date    TSH 2.366 04/05/2021    FREET4 1.20 01/14/2021       ASSESSMENT & PLAN:   1. Class 3 severe obesity with body mass index (BMI) of 50.0 to 59.9 in adult, unspecified obesity type, unspecified whether serious comorbidity present (CMS [facility])  Discussed the importance of Weight management. Discussed with patient that even modest weight loss of 5 lbs can help to reduce insulin resistance, Lower cholesterol and blood pressure.   Patient is advised that high carbohydrate diets (despite good glycemic control) can lead to excessive weight gain.   Reviewed the plate method for meal preparation.Discussed decreasing/eliminating high carb/ high fat snacks.Discussed elimination of sweetened  Beverages   Patient is encouraged to increase physical activity. Recommendations are for 30 minutes a day, 5 days a week. Encouraged to start slow and increase activity gradually as tolerated.  (ex. walking for 10 minutes a day, beginning strength training exercises, or just counting foot steps and increasing steps by 10 steps a day)  Discussed weight loss medications  Discussed weight loss surgery  Patient did Adipex for 4 months and can not take longer than  that due to history of IV drug use  Patient not a candidate for Contrave due to history of drug use  Taking Topamax for mood stabilization  Patient will benefit from Wegovy/Ozempic  Side effects discussed with the patient including pancreatitis and she verbalized understanding  - POCT HGB A1C  - semaglutide, weight loss, (WEGOVY) 0.25 mg/0.5 mL Subcutaneous Pen Injector; Inject 0.5 mL (0.25 mg total) under the skin Every 7 days for 30 days  Dispense: 2 mL; Refill: 0  - HGA1C (HEMOGLOBIN A1C WITH EST AVG GLUCOSE); Future  - BASIC METABOLIC PANEL, FASTING; Future    2. Elevated prolactin level  Slightly elevated prolactin  Patient with nipple person  Most likely due to medications, nipple stimulation or stress  - PROLACTIN; Future  - THYROXINE, FREE (FREE T4); Future  - THYROID STIMULATING HORMONE (SENSITIVE TSH); Future  No follow-ups on file.    This note was partially created using MModal Fluency Direct system (voice recognition software) and is inherently subject to errors including those of syntax and "sound-alike" substitutions which may escape proofreading.  In such instances, original meaning may be extrapolated by contextual derivation.

## 2021-10-14 ENCOUNTER — Ambulatory Visit (INDEPENDENT_AMBULATORY_CARE_PROVIDER_SITE_OTHER): Payer: MEDICAID | Admitting: Internal Medicine

## 2021-12-06 ENCOUNTER — Other Ambulatory Visit (INDEPENDENT_AMBULATORY_CARE_PROVIDER_SITE_OTHER): Payer: Self-pay | Admitting: Family

## 2021-12-06 ENCOUNTER — Encounter (INDEPENDENT_AMBULATORY_CARE_PROVIDER_SITE_OTHER): Payer: Self-pay | Admitting: Family

## 2021-12-06 DIAGNOSIS — R202 Paresthesia of skin: Secondary | ICD-10-CM

## 2021-12-06 MED ORDER — GABAPENTIN 300 MG CAPSULE
600.0000 mg | ORAL_CAPSULE | Freq: Two times a day (BID) | ORAL | 0 refills | Status: DC
Start: 2021-12-06 — End: 2022-01-09

## 2021-12-06 NOTE — Telephone Encounter (Signed)
Last scheduled appointment with you was 08/23/20, Currently scheduled future appointment is Visit date not found,     Patient has been seen within the last year: Yes.    Last refill 08/23/21 #120 R2  Med contract on file    Confirmed preferred pharmacy for this refill encounter is   Preferred Pharmacy       [id number]   [address]   Phone: [phone]: [phone]   Hours: Not open 24 hours        .     CNeta Mends MA  12/06/2021, 08:11  .

## 2021-12-06 NOTE — Telephone Encounter (Addendum)
-----   Message from Hob[place]. Ratliff sent at 12/06/2021  8:02 AM EST -----  Regarding: Refill question  Contact: [phone]  I need a refill on my  Gabapentin do I need to come in a see Janett Billow before I get that refill or will she call it in. I only got 2 days left of meds so I need to figure out what I need to do

## 2022-01-09 ENCOUNTER — Encounter (INDEPENDENT_AMBULATORY_CARE_PROVIDER_SITE_OTHER): Payer: Self-pay | Admitting: Family

## 2022-01-09 ENCOUNTER — Other Ambulatory Visit (INDEPENDENT_AMBULATORY_CARE_PROVIDER_SITE_OTHER): Payer: Self-pay | Admitting: Family

## 2022-01-09 DIAGNOSIS — R202 Paresthesia of skin: Secondary | ICD-10-CM

## 2022-01-09 MED ORDER — GABAPENTIN 300 MG CAPSULE
600.0000 mg | ORAL_CAPSULE | Freq: Two times a day (BID) | ORAL | 0 refills | Status: DC
Start: 2022-01-09 — End: 2022-02-08

## 2022-01-09 NOTE — Telephone Encounter (Addendum)
-----   Message from She[place]. Russom sent at 01/09/2022  9:48 AM EST -----  Regarding: Refill  Contact: [phone]  For Jan of 2024 can u please call me a refill in of my Gabapebtin. I'm out.      Last scheduled appointment with you was 08/23/21, Currently scheduled future appointment is Visit date not found,     Patient has been seen within the last year: Yes.    Med contract on file  Last fill 12/06/21 #120 R0    Confirmed preferred pharmacy for this refill encounter is   Preferred Pharmacy       [id number] [place] [facility] [address]    [address] [place] [address]    Phone: [phone] Fax: [phone]    Hours: Not open 24 hours        .     [name], MA  01/09/2022, 10:57

## 2022-02-07 ENCOUNTER — Encounter (INDEPENDENT_AMBULATORY_CARE_PROVIDER_SITE_OTHER): Payer: Self-pay | Admitting: Family

## 2022-02-08 ENCOUNTER — Other Ambulatory Visit: Payer: Self-pay

## 2022-02-08 ENCOUNTER — Other Ambulatory Visit (INDEPENDENT_AMBULATORY_CARE_PROVIDER_SITE_OTHER): Payer: MEDICAID

## 2022-02-08 ENCOUNTER — Ambulatory Visit: Payer: MEDICAID | Attending: Family | Admitting: Family

## 2022-02-08 ENCOUNTER — Encounter (INDEPENDENT_AMBULATORY_CARE_PROVIDER_SITE_OTHER): Payer: Self-pay | Admitting: Family

## 2022-02-08 VITALS — BP 124/82 | HR 78 | Temp 96.9°F | Ht 61.5 in | Wt 294.6 lb

## 2022-02-08 DIAGNOSIS — E8881 Metabolic syndrome: Secondary | ICD-10-CM | POA: Insufficient documentation

## 2022-02-08 DIAGNOSIS — M545 Low back pain, unspecified: Secondary | ICD-10-CM | POA: Insufficient documentation

## 2022-02-08 DIAGNOSIS — Z0000 Encounter for general adult medical examination without abnormal findings: Secondary | ICD-10-CM | POA: Insufficient documentation

## 2022-02-08 DIAGNOSIS — Z6841 Body Mass Index (BMI) 40.0 and over, adult: Secondary | ICD-10-CM | POA: Insufficient documentation

## 2022-02-08 DIAGNOSIS — G8929 Other chronic pain: Secondary | ICD-10-CM | POA: Insufficient documentation

## 2022-02-08 DIAGNOSIS — R202 Paresthesia of skin: Secondary | ICD-10-CM | POA: Insufficient documentation

## 2022-02-08 DIAGNOSIS — R200 Anesthesia of skin: Secondary | ICD-10-CM | POA: Insufficient documentation

## 2022-02-08 LAB — COMPREHENSIVE METABOLIC PANEL, NON-FASTING
ALBUMIN: 4.1 g/dL (ref 3.5–5.0)
ALKALINE PHOSPHATASE: 71 U/L (ref 38–126)
ALT (SGPT): 13 U/L (ref <=35)
ANION GAP: 8 mmol/L
AST (SGOT): 18 U/L (ref 14–36)
BILIRUBIN TOTAL: 0.4 mg/dL (ref 0.2–1.3)
BUN/CREA RATIO: 13
BUN: 11 mg/dL (ref 7–17)
CALCIUM: 9.7 mg/dL (ref 8.4–10.2)
CHLORIDE: 110 mmol/L — ABNORMAL HIGH (ref 98–107)
CO2 TOTAL: 24 mmol/L (ref 22–30)
CREATININE: 0.88 mg/dL (ref 0.52–1.04)
ESTIMATED GFR: 88 mL/min/{1.73_m2} (ref >=60)
GLUCOSE: 86 mg/dL (ref 74–106)
POTASSIUM: 4 mmol/L (ref 3.5–5.1)
PROTEIN TOTAL: 7 g/dL (ref 6.3–8.2)
SODIUM: 142 mmol/L (ref 137–145)

## 2022-02-08 LAB — CBC WITH DIFF
BASOPHIL #: 0.06 10*3/uL (ref 0.00–0.10)
BASOPHIL %: 1 % (ref 0–1)
EOSINOPHIL #: 0.67 10*3/uL — ABNORMAL HIGH (ref 0.00–0.40)
EOSINOPHIL %: 7 % — ABNORMAL HIGH (ref 0–6)
HCT: 45.7 % (ref 32.8–48.8)
HGB: 14.3 g/dL (ref 10.0–15.6)
LYMPHOCYTE #: 3.5 10*3/uL (ref 1.00–3.70)
LYMPHOCYTE %: 38 % (ref 14–45)
MCH: 28.9 pg (ref 25.9–32.7)
MCHC: 31.3 g/dL (ref 31.0–37.0)
MCV: 92.3 fL (ref 82.3–99.0)
MONOCYTE #: 0.71 10*3/uL — ABNORMAL HIGH (ref 0.00–0.70)
MONOCYTE %: 8 % (ref 0–14)
NEUTROPHIL #: 4.28 10*3/uL (ref 1.50–7.00)
NEUTROPHIL %: 46 % (ref 43–80)
PLATELETS: 395 10*3/uL — ABNORMAL HIGH (ref 129–381)
RBC: 4.95 10*6/uL (ref 3.54–5.34)
RDW: 13.7 % (ref 10.8–15.2)
WBC: 9.2 10*3/uL (ref 3.1–11.1)

## 2022-02-08 LAB — LIPID PANEL
CHOLESTEROL: 242 mg/dL — ABNORMAL HIGH (ref 0–199)
HDL CHOL: 29 mg/dL — ABNORMAL LOW (ref 40–60)
TRIGLYCERIDES: 446 mg/dL — ABNORMAL HIGH (ref 0–149)

## 2022-02-08 LAB — HGA1C (HEMOGLOBIN A1C WITH EST AVG GLUCOSE)
ESTIMATED AVERAGE GLUCOSE: 103 mg/dL
HEMOGLOBIN A1C: 5.2 % (ref <5.7)

## 2022-02-08 LAB — LDL CHOLESTEROL, DIRECT: LDL DIRECT: 157 mg/dL — ABNORMAL HIGH (ref <100)

## 2022-02-08 MED ORDER — GABAPENTIN 300 MG CAPSULE
600.0000 mg | ORAL_CAPSULE | Freq: Two times a day (BID) | ORAL | 2 refills | Status: DC
Start: 2022-02-08 — End: 2022-05-05

## 2022-02-08 MED ORDER — FENOFIBRATE NANOCRYSTALLIZED 145 MG TABLET
145.0000 mg | ORAL_TABLET | Freq: Every morning | ORAL | 3 refills | Status: AC
Start: 2022-02-08 — End: 2023-02-08

## 2022-02-08 MED ORDER — MELOXICAM 15 MG TABLET
15.0000 mg | ORAL_TABLET | Freq: Every day | ORAL | 3 refills | Status: AC
Start: 2022-02-08 — End: 2023-02-08
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
## 2022-02-08 NOTE — Assessment & Plan Note (Signed)
Chronic, Uncontrolled Problem  Encouraged healthy lifestyle modifications  Will repeat labs

## 2022-02-08 NOTE — Ancillary Notes (Signed)
[place]    Venipuncture performed in office on right arm antecubital vein, dry pressure dressing was applied to site and patient tolerated it well.  Specimen was centrifuged, aliquoted as needed and specimen was labeled and packaged for transport.    Bertram Gala, PHLEBOTOMIST  02/08/2022, 09:35
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
## 2022-02-08 NOTE — Addendum Note (Signed)
Addended by: Fidela Juneau on: 02/08/2022 01:27 PM     Modules accepted: Orders

## 2022-02-08 NOTE — Nursing Note (Signed)
Pt is here for annual exam and medication refill. Pt is doing well with no new concerns or questions.

## 2022-02-08 NOTE — Assessment & Plan Note (Addendum)
Chronic, Uncontrolled Problem  Increase Neurontin  Unable to take a mid-day dose due to her work schedule  [facility] was last reviewed on 02/08/2022  9:00 AM by Fidela Juneau and result was REVIEWED PDMP.   Controlled Agreement 08/23/21  Recommend increased aerobic exercise and weight loss.

## 2022-04-25 ENCOUNTER — Other Ambulatory Visit (HOSPITAL_COMMUNITY): Payer: Self-pay

## 2022-04-25 ENCOUNTER — Ambulatory Visit: Payer: MEDICAID | Attending: Family Medicine

## 2022-04-25 ENCOUNTER — Other Ambulatory Visit: Payer: Self-pay

## 2022-04-25 DIAGNOSIS — Z79899 Other long term (current) drug therapy: Secondary | ICD-10-CM | POA: Insufficient documentation

## 2022-04-26 DIAGNOSIS — Z79899 Other long term (current) drug therapy: Secondary | ICD-10-CM

## 2022-04-26 LAB — ECG 12 LEAD
Atrial Rate: 65 {beats}/min
Calculated P Axis: 63 degrees
Calculated R Axis: 74 degrees
Calculated T Axis: 29 degrees
PR Interval: 144 ms
QRS Duration: 80 ms
QT Interval: 424 ms
QTC Calculation: 440 ms
Ventricular rate: 65 {beats}/min

## 2022-05-05 ENCOUNTER — Other Ambulatory Visit (INDEPENDENT_AMBULATORY_CARE_PROVIDER_SITE_OTHER): Payer: Self-pay | Admitting: Family

## 2022-05-05 ENCOUNTER — Encounter (INDEPENDENT_AMBULATORY_CARE_PROVIDER_SITE_OTHER): Payer: Self-pay | Admitting: Family

## 2022-05-05 DIAGNOSIS — R200 Anesthesia of skin: Secondary | ICD-10-CM

## 2022-05-05 NOTE — Telephone Encounter (Signed)
Last visit with PCP was 02/08/2022.    Last office visit in the department was 02/08/2022 .  Currently scheduled future appointment in the department is Visit date not found.    Barry Dienes, MA, 05/05/2022 , 14:06

## 2022-05-05 NOTE — Telephone Encounter (Signed)
-----   Message from Lizza L. Dowlen sent at 05/05/2022  2:04 PM EDT -----  Regarding: Refill  Contact: [phone]  Good afternoon I am outta my Gabapentin. I was wondering if I could get a refill on them.

## 2022-05-08 ENCOUNTER — Encounter (INDEPENDENT_AMBULATORY_CARE_PROVIDER_SITE_OTHER): Payer: Self-pay | Admitting: Family

## 2022-05-08 MED ORDER — GABAPENTIN 300 MG CAPSULE
600.0000 mg | ORAL_CAPSULE | Freq: Two times a day (BID) | ORAL | 2 refills | Status: DC
Start: 2022-05-08 — End: 2022-08-15

## 2022-06-22 ENCOUNTER — Encounter (INDEPENDENT_AMBULATORY_CARE_PROVIDER_SITE_OTHER): Payer: MEDICAID | Admitting: PHYSICIAN ASSISTANT

## 2022-07-13 ENCOUNTER — Encounter (INDEPENDENT_AMBULATORY_CARE_PROVIDER_SITE_OTHER): Payer: Self-pay

## 2022-07-13 ENCOUNTER — Encounter (INDEPENDENT_AMBULATORY_CARE_PROVIDER_SITE_OTHER): Payer: MEDICAID | Admitting: PHYSICIAN ASSISTANT

## 2022-08-15 ENCOUNTER — Other Ambulatory Visit (INDEPENDENT_AMBULATORY_CARE_PROVIDER_SITE_OTHER): Payer: Self-pay | Admitting: Family

## 2022-08-15 ENCOUNTER — Encounter (INDEPENDENT_AMBULATORY_CARE_PROVIDER_SITE_OTHER): Payer: Self-pay | Admitting: Family

## 2022-08-15 DIAGNOSIS — R200 Anesthesia of skin: Secondary | ICD-10-CM

## 2022-08-15 NOTE — Telephone Encounter (Signed)
Danine Sallyanne Kuster  P [facility] (supporting Raoul Pitch, APRN,FNP-BC)16 minutes ago (12:43 PM)       I'm out of my Gabapentin they was lai[[[st fill 7/13 /24. Can I please get a refill on them. Thank u

## 2022-08-15 NOTE — Telephone Encounter (Signed)
Last visit with PCP was Visit date not found.    Last office visit in the department was 02/08/2022 .  Currently scheduled future appointment in the department is Visit date not found.    Barry Dienes, MA, 08/15/2022 , 13:01

## 2022-08-16 MED ORDER — GABAPENTIN 300 MG CAPSULE
600.0000 mg | ORAL_CAPSULE | Freq: Two times a day (BID) | ORAL | 0 refills | Status: AC
Start: 2022-08-16 — End: 2022-11-14

## 2022-12-04 ENCOUNTER — Emergency Department: Admission: EM | Admit: 2022-12-04 | Discharge: 2023-01-03 | Disposition: E | Payer: MEDICAID

## 2022-12-04 ENCOUNTER — Other Ambulatory Visit: Payer: Self-pay

## 2023-01-03 DIAGNOSIS — 419620001 Death: Secondary | SNOMED CT | POA: Insufficient documentation

## 2023-01-03 DEATH — deceased
